# Patient Record
Sex: Male | Born: 1975
Health system: Southern US, Community
[De-identification: ages and names within clinical notes are randomized; demographics above are authoritative.]

## PROBLEM LIST (undated history)

## (undated) DIAGNOSIS — R030 Elevated blood-pressure reading, without diagnosis of hypertension: Secondary | ICD-10-CM

## (undated) DIAGNOSIS — F32A Depression, unspecified: Secondary | ICD-10-CM

## (undated) DIAGNOSIS — IMO0001 Reserved for inherently not codable concepts without codable children: Secondary | ICD-10-CM

## (undated) DIAGNOSIS — T7840XA Allergy, unspecified, initial encounter: Secondary | ICD-10-CM

## (undated) DIAGNOSIS — I10 Essential (primary) hypertension: Secondary | ICD-10-CM

## (undated) DIAGNOSIS — R7301 Impaired fasting glucose: Secondary | ICD-10-CM

## (undated) DIAGNOSIS — F419 Anxiety disorder, unspecified: Secondary | ICD-10-CM

## (undated) HISTORY — DX: Essential (primary) hypertension: I10

## (undated) HISTORY — PX: NO PAST SURGERIES: SHX2092

## (undated) HISTORY — DX: Reserved for inherently not codable concepts without codable children: IMO0001

## (undated) HISTORY — DX: Impaired fasting glucose: R73.01

## (undated) HISTORY — DX: Elevated blood-pressure reading, without diagnosis of hypertension: R03.0

## (undated) HISTORY — DX: Anxiety disorder, unspecified: F41.9

## (undated) HISTORY — DX: Allergy, unspecified, initial encounter: T78.40XA

## (undated) HISTORY — DX: Depression, unspecified: F32.A

---

## 2012-11-16 ENCOUNTER — Ambulatory Visit (INDEPENDENT_AMBULATORY_CARE_PROVIDER_SITE_OTHER): Payer: BC Managed Care – PPO | Admitting: General Surgery

## 2012-11-16 ENCOUNTER — Encounter: Payer: Self-pay | Admitting: General Surgery

## 2012-11-16 VITALS — BP 160/84 | HR 84 | Resp 12 | Ht 75.0 in | Wt 187.0 lb

## 2012-11-16 DIAGNOSIS — N5082 Scrotal pain: Secondary | ICD-10-CM

## 2012-11-16 DIAGNOSIS — R109 Unspecified abdominal pain: Secondary | ICD-10-CM

## 2012-11-16 DIAGNOSIS — N509 Disorder of male genital organs, unspecified: Secondary | ICD-10-CM

## 2012-11-16 DIAGNOSIS — R1030 Lower abdominal pain, unspecified: Secondary | ICD-10-CM

## 2012-11-16 DIAGNOSIS — N50819 Testicular pain, unspecified: Secondary | ICD-10-CM

## 2012-11-16 NOTE — Progress Notes (Signed)
Patient ID: Shawn Andrade, male   DOB: Jan 22, 1976, 37 y.o.   MRN: [id number]  Chief Complaint  Patient presents with  . Other    hernia    HPI Shawn Andrade is a 37 y.o. male here today for an evaluation of an right inguinal hernia.Patient states Dr.Crissman found it over 1 year and  it is starting to be tender and sore in the scrotum. He describes pain intermittent in nature, located in suprapubic area and in right testicular region. He has not noted a swelling in the groin. No fever or chills, no bowel or urinary symptoms.  HPI  History reviewed. No pertinent past medical history.  History reviewed. No pertinent past surgical history.  History reviewed. No pertinent family history.  Social History History  Substance Use Topics  . Smoking status: Current Every Day Smoker -- 1.00 packs/day for 15 years  . Smokeless tobacco: Never Used  . Alcohol Use: No    No Known Allergies  Current Outpatient Prescriptions  Medication Sig Dispense Refill  . FLUoxetine (PROZAC) 20 MG tablet Take 1 tablet by mouth 2 (two) times daily.       No current facility-administered medications for this visit.    Review of Systems Review of Systems  Constitutional: Negative.   Respiratory: Negative.   Cardiovascular: Negative.     Blood pressure 160/84, pulse 84, resp. rate 12, height 6\' 3"  (1.905 m), weight 187 lb (84.823 kg).  Physical Exam Physical Exam  Constitutional: He appears well-developed and well-nourished.  Eyes: Conjunctivae are normal. No scleral icterus.  Neck: Neck supple.  Cardiovascular: Normal rate, regular rhythm and normal heart sounds.   Pulmonary/Chest: Breath sounds normal.  Abdominal: Soft. Bowel sounds are normal. There is no hepatomegaly. There is no tenderness. No hernia.  exam of the inguinal regions showed no edvises  of as hernia   Genitourinary: Testes normal.    Data Reviewed None   Assessment   No hernia present on my exam today. Patient discomfort   Involves the suprapubic area and the scrotum area -would benefit with GU evaluation.    Plan    Patient to return as needed. Discussed findings fully with pt. If GU eval is normal and he continues to have pain a CT scan may be helpful    Patient to be scheduled to see a urologist. An appointment has been scheduled with Dr. Sebastian Ache at [facility] for 11-22-12 at 3:45 pm. He is aware of all instructions.    Gohan Collister G 11/16/2012, 6:50 PM

## 2012-11-16 NOTE — Patient Instructions (Addendum)
Patient to return as needed.   Patient to be referred to urology.

## 2014-11-14 ENCOUNTER — Other Ambulatory Visit: Payer: Self-pay | Admitting: Family Medicine

## 2014-11-16 ENCOUNTER — Telehealth: Payer: Self-pay | Admitting: Family Medicine

## 2014-11-16 MED ORDER — FLUOXETINE HCL 20 MG PO TABS: 20.0000 mg | ORAL_TABLET | Freq: Two times a day (BID) | ORAL | Status: DC

## 2014-11-16 NOTE — Telephone Encounter (Signed)
Notified patient that a refill has been send  To his pharmacy.

## 2014-11-16 NOTE — Telephone Encounter (Signed)
Pt called and stated that dr Laural Benes needed to [facility] for her "blessing" to do refill. They stated they needed to speak to her before they did her refill.

## 2014-11-16 NOTE — Telephone Encounter (Signed)
Please Review

## 2014-11-16 NOTE — Telephone Encounter (Signed)
I don't know what he's referring to about them needing to talk to me. He is due for a refill. Refill sent to his pharmacy, if they need anything else let me know.

## 2014-11-19 ENCOUNTER — Telehealth: Payer: Self-pay

## 2014-11-19 MED ORDER — SERTRALINE HCL 100 MG PO TABS: 100.0000 mg | ORAL_TABLET | Freq: Every day | ORAL | Status: DC

## 2014-11-19 NOTE — Telephone Encounter (Signed)
Patient states he is on sertraline. Rx sent to his pharmacy. Appointment scheduled for this week.

## 2014-11-19 NOTE — Telephone Encounter (Signed)
Patient returned my call, he states that he is on the Sertraline 100mg , he has an appointment scheduled for 11/22/14. He is completely out of medication.

## 2014-11-19 NOTE — Telephone Encounter (Signed)
Refill request from [facility] for Sertraline 100mg  , confused since Prozac was just refilled a few days ago. I tried to call and speak with the patient and verify what medication he is actually taking, but he did not answer.

## 2014-11-22 ENCOUNTER — Encounter: Payer: Self-pay | Admitting: Family Medicine

## 2014-11-22 ENCOUNTER — Ambulatory Visit (INDEPENDENT_AMBULATORY_CARE_PROVIDER_SITE_OTHER): Payer: BLUE CROSS/BLUE SHIELD | Admitting: Family Medicine

## 2014-11-22 VITALS — BP 118/82 | HR 60 | Temp 97.6°F | Resp 18 | Ht 74.0 in | Wt 201.0 lb

## 2014-11-22 DIAGNOSIS — Z720 Tobacco use: Secondary | ICD-10-CM

## 2014-11-22 DIAGNOSIS — R5382 Chronic fatigue, unspecified: Secondary | ICD-10-CM

## 2014-11-22 DIAGNOSIS — F411 Generalized anxiety disorder: Secondary | ICD-10-CM | POA: Diagnosis not present

## 2014-11-22 DIAGNOSIS — Z0000 Encounter for general adult medical examination without abnormal findings: Secondary | ICD-10-CM | POA: Diagnosis not present

## 2014-11-22 DIAGNOSIS — IMO0001 Reserved for inherently not codable concepts without codable children: Secondary | ICD-10-CM

## 2014-11-22 DIAGNOSIS — R030 Elevated blood-pressure reading, without diagnosis of hypertension: Secondary | ICD-10-CM

## 2014-11-22 DIAGNOSIS — F419 Anxiety disorder, unspecified: Secondary | ICD-10-CM | POA: Insufficient documentation

## 2014-11-22 MED ORDER — SERTRALINE HCL 100 MG PO TABS: 100.0000 mg | ORAL_TABLET | Freq: Every day | ORAL | Status: DC

## 2014-11-22 NOTE — Assessment & Plan Note (Signed)
Under good control at this time. Continue current regimen. Continue to monitor. Will continue his zoloft. Refill given today. Call with any concerns or problems. Will follow up in 6 months for PE.

## 2014-11-22 NOTE — Progress Notes (Signed)
BP 118/82 mmHg  Pulse 60  Temp(Src) 97.6 F (36.4 C)  Resp 18  Ht 6\' 2"  (1.88 m)  Wt 201 lb (91.173 kg)  BMI 25.80 kg/m2  SpO2 94%   Subjective:    Patient ID: Shawn Andrade, male    DOB: 1975-07-09, 39 y.o.   MRN: [id number]  HPI: Shawn Andrade is a 39 y.o. male  Chief Complaint  Patient presents with  . Anxiety    Patient states that anxiety is under good control.    ANXIETY/STRESS Duration:controlled Anxious mood: no  Excessive worrying: no Irritability: no  Sweating: no Nausea: no Palpitations:no Hyperventilation: no Panic attacks: no Agoraphobia: no  Obscessions/compulsions: no Depressed mood: no Depression screen [facility] 2/9 11/22/2014  Decreased Interest 0  Down, Depressed, Hopeless 0  PHQ - 2 Score 0  Altered sleeping 0  Tired, decreased energy 1  Change in appetite 0  Feeling bad or failure about yourself  0  Trouble concentrating 0  Moving slowly or fidgety/restless 0  Suicidal thoughts 0  PHQ-9 Score 1  Difficult doing work/chores Not difficult at all  GAD7: 0 Anhedonia: no Weight changes: no Insomnia: no   Hypersomnia: no Fatigue/loss of energy: yes Feelings of worthlessness: no Feelings of guilt: no Impaired concentration/indecisiveness: no Suicidal ideations: no  Crying spells: no Recent Stressors/Life Changes: no   Relationship problems: no   Family stress: no     Financial stress: no    Job stress: no    Recent death/loss: no118  Relevant past medical, surgical, family and social history reviewed and updated as indicated. Interim medical history since our last visit reviewed. Allergies and medications reviewed and updated.  Review of Systems  Constitutional: Negative.   Respiratory: Negative.   Cardiovascular: Negative.   Neurological: Negative.   Psychiatric/Behavioral: Negative.    Per HPI unless specifically indicated above    Objective:    BP 118/82 mmHg  Pulse 60  Temp(Src) 97.6 F (36.4 C)  Resp 18  Ht 6\' 2"   (1.88 m)  Wt 201 lb (91.173 kg)  BMI 25.80 kg/m2  SpO2 94%  Wt Readings from Last 3 Encounters:  11/22/14 201 lb (91.173 kg)  07/26/14 203 lb (92.08 kg)  11/16/12 187 lb (84.823 kg)    Physical Exam  Constitutional: He is oriented to person, place, and time. He appears well-developed and well-nourished. No distress.  HENT:  Head: Normocephalic and atraumatic.  Right Ear: Hearing normal.  Left Ear: Hearing normal.  Nose: Nose normal.  Eyes: Conjunctivae and lids are normal. Pupils are equal, round, and reactive to light. Right eye exhibits no discharge. Left eye exhibits no discharge. No scleral icterus.  Cardiovascular: Normal rate, regular rhythm and normal heart sounds.  Exam reveals no gallop and no friction rub.   No murmur heard. Pulmonary/Chest: Effort normal and breath sounds normal. No respiratory distress.  Musculoskeletal: Normal range of motion.  Neurological: He is alert and oriented to person, place, and time.  Skin: Skin is warm, dry and intact. No rash noted.  Psychiatric: He has a normal mood and affect. His speech is normal and behavior is normal. Judgment and thought content normal. Cognition and memory are normal.  Nursing note and vitals reviewed.       Assessment & Plan:   Problem List Items Addressed This Visit      Other   Anxiety disorder - Primary    Under good control at this time. Continue current regimen. Continue to monitor. Will continue  his zoloft. Refill given today. Call with any concerns or problems. Will follow up in 6 months for PE.        Other Visit Diagnoses    Tobacco abuse        Interested in quitting but afraid of side effects. Will consider and call if he wants to start something. Will send in chantix or wellbutrin and check in 1 mo        Follow up plan: Return in about 6 months (around 05/24/2015) for PE.

## 2014-11-22 NOTE — Patient Instructions (Addendum)
Stress and Stress Management Stress is a normal reaction to life events. It is what you feel when life demands more than you are used to or more than you can handle. Some stress can be useful. For example, the stress reaction can help you catch the last bus of the day, study for a test, or meet a deadline at work. But stress that occurs too often or for too long can cause problems. It can affect your emotional health and interfere with relationships and normal daily activities. Too much stress can weaken your immune system and increase your risk for physical illness. If you already have a medical problem, stress can make it worse. CAUSES  All sorts of life events may cause stress. An event that causes stress for one person may not be stressful for another person. Major life events commonly cause stress. These may be positive or negative. Examples include losing your job, moving into a new home, getting married, having a baby, or losing a loved one. Less obvious life events may also cause stress, especially if they occur day after day or in combination. Examples include working long hours, driving in traffic, caring for children, being in debt, or being in a difficult relationship. SIGNS AND SYMPTOMS Stress may cause emotional symptoms including, the following:  Anxiety. This is feeling worried, afraid, on edge, overwhelmed, or out of control.  Anger. This is feeling irritated or impatient.  Depression. This is feeling sad, down, helpless, or guilty.  Difficulty focusing, remembering, or making decisions. Stress may cause physical symptoms, including the following:   Aches and pains. These may affect your head, neck, back, stomach, or other areas of your body.  Tight muscles or clenched jaw.  Low energy or trouble sleeping. Stress may cause unhealthy behaviors, including the following:   Eating to feel better (overeating) or skipping meals.  Sleeping too little, too much, or both.  Working  too much or putting off tasks (procrastination).  Smoking, drinking alcohol, or using drugs to feel better. DIAGNOSIS  Stress is diagnosed through an assessment by your health care provider. Your health care provider will ask questions about your symptoms and any stressful life events.Your health care provider will also ask about your medical history and may order blood tests or other tests. Certain medical conditions and medicine can cause physical symptoms similar to stress. Mental illness can cause emotional symptoms and unhealthy behaviors similar to stress. Your health care provider may refer you to a mental health professional for further evaluation.  TREATMENT  Stress management is the recommended treatment for stress.The goals of stress management are reducing stressful life events and coping with stress in healthy ways.  Techniques for reducing stressful life events include the following:  Stress identification. Self-monitor for stress and identify what causes stress for you. These skills may help you to avoid some stressful events.  Time management. Set your priorities, keep a calendar of events, and learn to say "no." These tools can help you avoid making too many commitments. Techniques for coping with stress include the following:  Rethinking the problem. Try to think realistically about stressful events rather than ignoring them or overreacting. Try to find the positives in a stressful situation rather than focusing on the negatives.  Exercise. Physical exercise can release both physical and emotional tension. The key is to find a form of exercise you enjoy and do it regularly.  Relaxation techniques. These relax the body and mind. Examples include yoga, meditation, tai chi, biofeedback, deep  breathing, progressive muscle relaxation, listening to music, being out in nature, journaling, and other hobbies. Again, the key is to find one or more that you enjoy and can do  regularly.  Healthy lifestyle. Eat a balanced diet, get plenty of sleep, and do not smoke. Avoid using alcohol or drugs to relax.  Strong support network. Spend time with family, friends, or other people you enjoy being around.Express your feelings and talk things over with someone you trust. Counseling or talktherapy with a mental health professional may be helpful if you are having difficulty managing stress on your own. Medicine is typically not recommended for the treatment of stress.Talk to your health care provider if you think you need medicine for symptoms of stress. HOME CARE INSTRUCTIONS  Keep all follow-up visits as directed by your health care provider.  Take all medicines as directed by your health care provider. SEEK MEDICAL CARE IF:  Your symptoms get worse or you start having new symptoms.  You feel overwhelmed by your problems and can no longer manage them on your own. SEEK IMMEDIATE MEDICAL CARE IF:  You feel like hurting yourself or someone else. Document Released: 11/18/2000 Document Revised: 10/09/2013 Document Reviewed: 01/17/2013 [facility] Patient Information 2015 [place], [place]. This information is not intended to replace advice given to you by your health care provider. Make sure you discuss any questions you have with your health care provider. You Can Quit Smoking If you are ready to quit smoking or are thinking about it, congratulations! You have chosen to help yourself be healthier and live longer! There are lots of different ways to quit smoking. Nicotine gum, nicotine patches, a nicotine inhaler, or nicotine nasal spray can help with physical craving. Hypnosis, support groups, and medicines help break the habit of smoking. TIPS TO GET OFF AND STAY OFF CIGARETTES  Learn to predict your moods. Do not let a bad situation be your excuse to have a cigarette. Some situations in your life might tempt you to have a cigarette.  Ask friends and co-workers not to  smoke around you.  Make your home smoke-free.  Never have "just one" cigarette. It leads to wanting another and another. Remind yourself of your decision to quit.  On a card, make a list of your reasons for not smoking. Read it at least the same number of times a day as you have a cigarette. Tell yourself everyday, "I do not want to smoke. I choose not to smoke."  Ask someone at home or work to help you with your plan to quit smoking.  Have something planned after you eat or have a cup of coffee. Take a walk or get other exercise to perk you up. This will help to keep you from overeating.  Try a relaxation exercise to calm you down and decrease your stress. Remember, you may be tense and nervous the first two weeks after you quit. This will pass.  Find new activities to keep your hands busy. Play with a pen, coin, or rubber band. Doodle or draw things on paper.  Brush your teeth right after eating. This will help cut down the craving for the taste of tobacco after meals. You can try mouthwash too.  Try gum, breath mints, or diet candy to keep something in your mouth. IF YOU SMOKE AND WANT TO QUIT:  Do not stock up on cigarettes. Never buy a carton. Wait until one pack is finished before you buy another.  Never carry cigarettes with you at work  or at home.  Keep cigarettes as far away from you as possible. Leave them with someone else.  Never carry matches or a lighter with you.  Ask yourself, "Do I need this cigarette or is this just a reflex?"  Bet with someone that you can quit. Put cigarette money in a piggy bank every morning. If you smoke, you give up the money. If you do not smoke, by the end of the week, you keep the money.  Keep trying. It takes 21 days to change a habit!  Talk to your doctor about using medicines to help you quit. These include nicotine replacement gum, lozenges, or skin patches. Document Released: 03/21/2009 Document Revised: 08/17/2011 Document  Reviewed: 03/21/2009 [facility] Patient Information 2015 [place], [place]. This information is not intended to replace advice given to you by your health care provider. Make sure you discuss any questions you have with your health care provider.

## 2015-03-13 ENCOUNTER — Telehealth: Payer: Self-pay | Admitting: Family Medicine

## 2015-03-13 NOTE — Telephone Encounter (Signed)
Pt called stated he needs a refill on Sertraline. [facility] [place]. Thanks.

## 2015-03-13 NOTE — Telephone Encounter (Signed)
[facility] refills, they will fill and contact patient

## 2015-03-20 ENCOUNTER — Ambulatory Visit: Payer: Commercial Indemnity

## 2015-03-20 ENCOUNTER — Ambulatory Visit
Admission: EM | Admit: 2015-03-20 | Discharge: 2015-03-20 | Disposition: A | Discharge status: Home / Self Care | Payer: Commercial Indemnity | Attending: Family Medicine | Admitting: Family Medicine

## 2015-03-20 ENCOUNTER — Encounter: Payer: Self-pay | Admitting: Unknown Physician Specialty

## 2015-03-20 ENCOUNTER — Ambulatory Visit (INDEPENDENT_AMBULATORY_CARE_PROVIDER_SITE_OTHER): Payer: Managed Care, Other (non HMO) | Admitting: Unknown Physician Specialty

## 2015-03-20 VITALS — BP 144/90 | HR 72 | Temp 98.1°F | Ht 74.2 in | Wt 204.6 lb

## 2015-03-20 DIAGNOSIS — Z23 Encounter for immunization: Secondary | ICD-10-CM | POA: Diagnosis not present

## 2015-03-20 DIAGNOSIS — R7301 Impaired fasting glucose: Secondary | ICD-10-CM | POA: Insufficient documentation

## 2015-03-20 DIAGNOSIS — T148 Other injury of unspecified body region: Secondary | ICD-10-CM

## 2015-03-20 DIAGNOSIS — J309 Allergic rhinitis, unspecified: Secondary | ICD-10-CM | POA: Insufficient documentation

## 2015-03-20 DIAGNOSIS — T148XXA Other injury of unspecified body region, initial encounter: Secondary | ICD-10-CM

## 2015-03-20 DIAGNOSIS — L03012 Cellulitis of left finger: Secondary | ICD-10-CM

## 2015-03-20 MED ORDER — SULFAMETHOXAZOLE-TRIMETHOPRIM 800-160 MG PO TABS: 1.0000 | ORAL_TABLET | Freq: Two times a day (BID) | ORAL | Status: DC

## 2015-03-20 NOTE — Discharge Instructions (Signed)
Keep clean. Warm soaks as discussed. Elevate. Take antibiotic prescription as directed.   Follow up closely with PCP in 2-3 days. Return to Urgent care for new or worsening concerns.   Cellulitis Cellulitis is an infection of the skin and the tissue beneath it. The infected area is usually red and tender. Cellulitis occurs most often in the arms and lower legs.  CAUSES  Cellulitis is caused by bacteria that enter the skin through cracks or cuts in the skin. The most common types of bacteria that cause cellulitis are staphylococci and streptococci. SIGNS AND SYMPTOMS   Redness and warmth.  Swelling.  Tenderness or pain.  Fever. DIAGNOSIS  Your health care provider can usually determine what is wrong based on a physical exam. Blood tests may also be done. TREATMENT  Treatment usually involves taking an antibiotic medicine. HOME CARE INSTRUCTIONS   Take your antibiotic medicine as directed by your health care provider. Finish the antibiotic even if you start to feel better.  Keep the infected arm or leg elevated to reduce swelling.  Apply a warm cloth to the affected area up to 4 times per day to relieve pain.  Take medicines only as directed by your health care provider.  Keep all follow-up visits as directed by your health care provider. SEEK MEDICAL CARE IF:   You notice red streaks coming from the infected area.  Your red area gets larger or turns dark in color.  Your bone or joint underneath the infected area becomes painful after the skin has healed.  Your infection returns in the same area or another area.  You notice a swollen bump in the infected area.  You develop new symptoms.  You have a fever. SEEK IMMEDIATE MEDICAL CARE IF:   You feel very sleepy.  You develop vomiting or diarrhea.  You have a general ill feeling (malaise) with muscle aches and pains.   This information is not intended to replace advice given to you by your health care provider. Make  sure you discuss any questions you have with your health care provider.   Document Released: 03/04/2005 Document Revised: 02/13/2015 Document Reviewed: 08/10/2011 Elsevier Interactive Patient Education [employer]2016 [employer] Inc.

## 2015-03-20 NOTE — Progress Notes (Signed)
   BP 144/90 mmHg  Pulse 72  Temp(Src) 98.1 F (36.7 C)  Ht 6' 2.2" (1.885 m)  Wt 204 lb 9.6 oz (92.806 kg)  BMI 26.12 kg/m2  SpO2 98%   Subjective:    Patient ID: Shawn Andrade, male    DOB: 08/11/1975, 39 y.o.   MRN: [id number]  HPI: Shawn Andrade is a 39 y.o. male  Chief Complaint  Patient presents with  . Hand Pain    pt states his index finger on his left hand is swollen and painful. States he got a splinter yesterday morning and finger has been swollen and painful since. States he cannot bend the finger.    As above, Pt states he took the splinter out and became swollen.  He tried to squeeze it and get the fluid out and got a tiny amount of serous fluid.  No fever.    Relevant past medical, surgical, family and social history reviewed and updated as indicated. Interim medical history since our last visit reviewed. Allergies and medications reviewed and updated.  Review of Systems  All other systems reviewed and are negative.   Per HPI unless specifically indicated above     Objective:    BP 144/90 mmHg  Pulse 72  Temp(Src) 98.1 F (36.7 C)  Ht 6' 2.2" (1.885 m)  Wt 204 lb 9.6 oz (92.806 kg)  BMI 26.12 kg/m2  SpO2 98%  Wt Readings from Last 3 Encounters:  03/20/15 204 lb 9.6 oz (92.806 kg)  11/22/14 201 lb (91.173 kg)  07/26/14 203 lb (92.08 kg)    Physical Exam  Constitutional: He is oriented to person, place, and time. He appears well-developed and well-nourished. No distress.  HENT:  Head: Normocephalic and atraumatic.  Eyes: Conjunctivae and lids are normal. Right eye exhibits no discharge. Left eye exhibits no discharge. No scleral icterus.  Cardiovascular: Normal rate and regular rhythm.   Pulmonary/Chest: Effort normal. No respiratory distress.  Abdominal: Normal appearance. There is no splenomegaly or hepatomegaly.  Musculoskeletal: Normal range of motion.  Left ring finger with swollen and tender PIP joint.    Neurological: He is alert and  oriented to person, place, and time.  Skin: Skin is intact. No rash noted. No pallor.  Psychiatric: He has a normal mood and affect. His behavior is normal. Judgment and thought content normal.  Vitals reviewed.    Assessment & Plan:   Problem List Items Addressed This Visit    None    Visit Diagnoses    Cellulitis of finger of left hand    -  Primary    discussed pt with Dr. Dossie Arbourrissman who came in to see patient.  Rx for antibiotics.  Rx for Septra.  Refer to hand surgeon for tomorrow to evaluate.      Relevant Orders    Ambulatory referral to Orthopedic Surgery        Follow up plan: Return for With Dr. Hyacinth MeekerMiller ASAP.

## 2015-03-20 NOTE — ED Provider Notes (Signed)
[facility] Emergency Department Provider Note  ____________________________________________  Time seen: Approximately 7:09 PM  I have reviewed the triage vital signs and the nursing notes.   HISTORY  Chief Complaint Finger Injury  HPI Shawn Andrade is a 39 y.o. male presents for complaints of pain, redness and swelling to left second finger. States yesterday he grabbed a 2x4 piece of wood and obtained a splinter to left second finger at area of discomfort. Patient reports he then pulled out splinter. States unsure if he got all of the splinter out. States some pain and tenderness initially, but reports gradually redness and swelling. States current pain is 5/10 to left second finger. Denies pain radiation. Denies numbness or tingling sensation. States pain is mostly with bending or touching the area that is swollen. Denies other pain to hand. Denies falling or direct trauma. Denies other pain. Denies fevers.   Reports very small amount of drainage expressed when "squeezed". PAtient states wants the area opened.   Reports he went to his PCP office this afternoon at Dr Christell Faith office and reports he was told infection and Rx for oral antibiotics given. States PCP told him they did not need to perform I & D at that time, and they would refer him to Dr Robbie Lis ortho for I&D if needed. Reports rx for BID Bactrim x 10 days. Reports tetanus was updated at PCP office.     Past Medical History  Diagnosis Date  . Allergy   . Elevated blood pressure   . IFG (impaired fasting glucose)   . Anxiety     Patient Active Problem List   Diagnosis Date Noted  . Allergic rhinitis 03/20/2015  . IFG (impaired fasting glucose) 03/20/2015  . Anxiety disorder 11/22/2014  . Scrotal pain 11/16/2012  . Groin pain 11/16/2012  . Testicular pain 11/16/2012    Past Surgical History  Procedure Laterality Date  . No past surgeries      Current Outpatient Rx  Name  Route  Sig   Dispense  Refill  . LORazepam (ATIVAN) 1 MG tablet   Oral   Take 1 mg by mouth every 8 (eight) hours. Take 1/2 to 1 tablet daily PRN         . sertraline (ZOLOFT) 100 MG tablet   Oral   Take 1 tablet (100 mg total) by mouth daily.   30 tablet   6   . sulfamethoxazole-trimethoprim (BACTRIM DS,SEPTRA DS) 800-160 MG tablet   Oral   Take 1 tablet by mouth 2 (two) times daily.   20 tablet   0     Allergies Review of patient's allergies indicates no known allergies.  Family History  Problem Relation Age of Onset  . Hypertension Father   . Hypertension Mother   . Anxiety disorder Mother   . Cancer Paternal Grandfather     Social History Social History  Substance Use Topics  . Smoking status: Current Every Day Smoker -- 1.00 packs/day for 15 years    Types: Cigarettes  . Smokeless tobacco: Current User    Types: Snuff  . Alcohol Use: No     Comment: one or less per day    Review of Systems Constitutional: No fever/chills Eyes: No visual changes. ENT: No sore throat. Cardiovascular: Denies chest pain. Respiratory: Denies shortness of breath. Gastrointestinal: No abdominal pain.  No nausea, no vomiting.  No diarrhea.  No constipation. Genitourinary: Negative for dysuria. Musculoskeletal: Negative for back pain. Left second finger redness and  swelling.  Skin: Negative for rash. Neurological: Negative for headaches, focal weakness or numbness.  10-point ROS otherwise negative.  ____________________________________________   PHYSICAL EXAM:  VITAL SIGNS: ED Triage Vitals  Enc Vitals Group     BP 03/20/15 1753 149/97 mmHg     Pulse Rate 03/20/15 1753 84     Resp 03/20/15 1753 16     Temp 03/20/15 1753 97.5 F (36.4 C)     Temp Source 03/20/15 1753 Oral     SpO2 03/20/15 1753 99 %     Weight 03/20/15 1753 205 lb (92.987 kg)     Height 03/20/15 1753  (1.93 m)     Head Cir --      Peak Flow --      Pain Score 03/20/15 1756 1     Pain Loc --      Pain  Edu? --      Excl. in GC? --     Constitutional: Alert and oriented. Well appearing and in no acute distress. Eyes: Conjunctivae are normal. PERRL. EOMI. Head: Atraumatic.  Nose: No congestion/rhinnorhea.  Mouth/Throat: Mucous membranes are moist. Cardiovascular: Normal rate, regular rhythm. Grossly normal heart sounds.  Good peripheral circulation. Respiratory: Normal respiratory effort.  No retractions. Lungs CTAB. Musculoskeletal: No lower or upper extremity tenderness nor edema.  No joint effusions. Left second finger pain, see skin below.  Neurologic:  Normal speech and language.No gait instability. Skin:  Skin is warm, dry and intact. No rash noted. Except: left second finger along PIP joint with mild swelling, mild erythema, small crusting abrasion medial PIP joint consistent of reported location of splinter insertion site. Minimal fluctuance. No induration. No visible foreign body. No tendon or motor deficit. Full ROM, except slight decreased ability to flex at PIP joint only, sensation intact. Cap refill < 2 secs. Left hand otherwise nontender.  Psychiatric: Mood and affect are normal. Speech and behavior are normal.  ____________________________________________   LABS (all labs ordered are listed, but only abnormal results are displayed)  Labs Reviewed - No data to display   PROCEDURES  Procedure(s) performed:   Procedure explained and verbal consent obtained. Consent: Verbal consent obtained. Written consent not obtained. Risks and benefits: risks, benefits and alternatives were discussed Patient identity confirmed: verbally with patient and hospital-assigned identification number  Consent given by: patient   Incision & Drainage Procedure completed in sterile manner.  Location: Left second medial finger lateral to PIP joint Left hand soaked in betadine and saline.  Area cleaned and prepped with betadine.  Patient opted to defer anesthesia.  #11 blade scalpel used  for incision, small superficial incision made at sight of present abrasion.  Very small amount purulent drainage expressed.  Probed with sterile forceps to break up loculation.  Forceps used to explore would and remove foreign body. Very small foreign body yellowish and wood grain appearance removed.  Again cleaned with betadine.  Patient tolerated well and reports well improved post procedure. Dressing applied.   Antibiotic ointment and dressing applied.  Wound care instructions provided.  Observe for any signs of infection or other problems.     Radiology EXAM: LEFT SECOND FINGER 2+V  COMPARISON: None.  FINDINGS: Frontal, oblique, and lateral views were obtained. There is soft tissue swelling in the region of the second PIP joint, most markedly along the dorsal aspect. No radiopaque foreign body is appreciated. No fracture or dislocation. Joint spaces appear intact. No erosive change or bony destruction.  IMPRESSION: Soft tissue swelling in  the region of the second PIP joint, particularly along the dorsal aspect. No bony abnormality. No radiopaque foreign body. Joint spaces appear normal.   Electronically Signed By: Bretta BangWilliam Woodruff III M.D. On: 03/20/2015 18:39       ______________________________________   INITIAL IMPRESSION / ASSESSMENT AND PLAN / ED COURSE  Pertinent labs & imaging results that were available during my care of the patient were reviewed by me and considered in my medical decision making (see chart for details).  Presents for redness, swelling and pain to left second finger. Reports obtained splinter at home yesterday, pulled splinter out and gradually pain and swelling. Seen by PCP today and diagnoses with cellulitis of left second finger and RX bactrim. Left second finger xray obtained and showed soft tissue swelling in the region of second PIP joint, no bony abnormality, no radiopaque foreign body, joint spaces appeared normal per radiology.    Patient deferred anesthesia. Small superficial incision made at site and obtained very small amount of purulence but small foreign body, likely remnants of wood splinter found and removed, no other foreign body found. Patient reports post procedure pain and swelling improved. Patient reports that he is able to move and bend finger much more easily and without as much discomfort. Patient directed to continue antibiotic prescription of Bactrim previously given by primary care physician. Directed to elevate hand as well as intermittently soak in warm water. Discussed cleaning multiple times per day. Discussed close monitoring as well as close follow-up with primary care physician. Patient denies need for pain medication. States he will take over-the-counter Tylenol or ibuprofen as needed for pain. Tetanus immunization was updated at primary care office today.Discussed follow up with Primary care physician this week. Discussed follow up and return parameters including no resolution or any worsening concerns. Patient verbalized understanding and agreed to plan.   ____________________________________________   FINAL CLINICAL IMPRESSION(S) / ED DIAGNOSES  Final diagnoses:  Cellulitis of left index finger  Splinter in skin       Renford DillsLindsey Virginia Curl, NP 03/20/15 2028

## 2015-03-20 NOTE — ED Notes (Signed)
Yesterday c/o left index finger swelling and pain since yesterday. Pt scraped hand against a 2X4. Pt reports he retrieved a splinter from the affected area. Pt requesting an I & D.

## 2015-03-22 ENCOUNTER — Telehealth: Payer: Self-pay

## 2015-03-22 NOTE — Telephone Encounter (Signed)
Called Mr. Caroleen HammanRumley and left message on machine requesting that he return my call regarding his referral appointment.  Patient was seen in the ED and would like to know if he still needs to see ortho.

## 2015-03-22 NOTE — Telephone Encounter (Signed)
Spoke with patient and he stated that he ended up going to [facility] a few hours after leaving our office on 03/20/15 due to sever pain.  He stated that the Urgent Care was able to relieve the pressure and pain and that he no longer needs to see an Ortho.

## 2015-05-17 ENCOUNTER — Encounter: Payer: BLUE CROSS/BLUE SHIELD | Admitting: Family Medicine

## 2015-06-04 ENCOUNTER — Encounter: Payer: Managed Care, Other (non HMO) | Admitting: Family Medicine

## 2015-06-24 ENCOUNTER — Encounter: Payer: Managed Care, Other (non HMO) | Admitting: Family Medicine

## 2015-07-01 ENCOUNTER — Encounter: Payer: Self-pay | Admitting: Family Medicine

## 2015-07-01 ENCOUNTER — Ambulatory Visit (INDEPENDENT_AMBULATORY_CARE_PROVIDER_SITE_OTHER): Payer: Managed Care, Other (non HMO) | Admitting: Family Medicine

## 2015-07-01 VITALS — BP 122/92 | HR 69 | Temp 98.5°F | Ht 74.0 in | Wt 203.0 lb

## 2015-07-01 DIAGNOSIS — F411 Generalized anxiety disorder: Secondary | ICD-10-CM | POA: Diagnosis not present

## 2015-07-01 DIAGNOSIS — Z720 Tobacco use: Secondary | ICD-10-CM | POA: Diagnosis not present

## 2015-07-01 DIAGNOSIS — Z0000 Encounter for general adult medical examination without abnormal findings: Secondary | ICD-10-CM

## 2015-07-01 DIAGNOSIS — R030 Elevated blood-pressure reading, without diagnosis of hypertension: Secondary | ICD-10-CM

## 2015-07-01 DIAGNOSIS — R7301 Impaired fasting glucose: Secondary | ICD-10-CM | POA: Diagnosis not present

## 2015-07-01 DIAGNOSIS — R5382 Chronic fatigue, unspecified: Secondary | ICD-10-CM | POA: Diagnosis not present

## 2015-07-01 DIAGNOSIS — Z79899 Other long term (current) drug therapy: Secondary | ICD-10-CM

## 2015-07-01 DIAGNOSIS — IMO0001 Reserved for inherently not codable concepts without codable children: Secondary | ICD-10-CM

## 2015-07-01 DIAGNOSIS — Z113 Encounter for screening for infections with a predominantly sexual mode of transmission: Secondary | ICD-10-CM

## 2015-07-01 LAB — CBC WITH DIFFERENTIAL/PLATELET
Hematocrit: 46.6 % (ref 37.5–51.0)
Hemoglobin: 16.9 g/dL (ref 12.6–17.7)
Lymphocytes Absolute: 2.4 10*3/uL (ref 0.7–3.1)
Lymphs: 29 %
MCH: 30.6 pg (ref 26.6–33.0)
MCHC: 36.3 g/dL — ABNORMAL HIGH (ref 31.5–35.7)
MCV: 84 fL (ref 79–97)
MID (Absolute): 0.7 10*3/uL (ref 0.1–1.6)
MID: 8 %
Neutrophils Absolute: 5.3 10*3/uL (ref 1.4–7.0)
Neutrophils: 63 %
PLATELETS: 271 10*3/uL (ref 150–379)
RBC: 5.53 x10E6/uL (ref 4.14–5.80)
RDW: 14 % (ref 12.3–15.4)
WBC: 8.4 10*3/uL (ref 3.4–10.8)

## 2015-07-01 LAB — LIPID PANEL PICCOLO, WAIVED
CHOL/HDL RATIO PICCOLO,WAIVE: 3.8 mg/dL
CHOLESTEROL PICCOLO, WAIVED: 218 mg/dL — AB (ref <200)
HDL CHOL PICCOLO, WAIVED: 57 mg/dL — AB (ref >59)
LDL CHOL CALC PICCOLO WAIVED: 109 mg/dL — AB (ref <100)
Triglycerides Piccolo,Waived: 260 mg/dL — ABNORMAL HIGH (ref <150)
VLDL Chol Calc Piccolo,Waive: 52 mg/dL — ABNORMAL HIGH (ref <30)

## 2015-07-01 LAB — MICROALBUMIN, URINE WAIVED
CREATININE, URINE WAIVED: 300 mg/dL (ref 10–300)
MICROALB, UR WAIVED: 80 mg/L — AB (ref 0–19)

## 2015-07-01 MED ORDER — BUPROPION HCL ER (SR) 150 MG PO TB12
ORAL_TABLET | ORAL | Status: DC
Start: 2015-07-01 — End: 2016-08-13

## 2015-07-01 MED ORDER — SERTRALINE HCL 100 MG PO TABS: 100.0000 mg | ORAL_TABLET | Freq: Every day | ORAL | Status: DC

## 2015-07-01 MED ORDER — LORAZEPAM 1 MG PO TABS
ORAL_TABLET | ORAL | Status: DC
Start: 2015-07-01 — End: 2017-01-29

## 2015-07-01 NOTE — Progress Notes (Signed)
BP 122/92 mmHg  Pulse 69  Temp(Src) 98.5 F (36.9 C)  Ht  (1.88 m)  Wt 203 lb (92.08 kg)  BMI 26.05 kg/m2  SpO2 93%   Subjective:    Patient ID: Shawn Andrade, male    DOB: [birth year], 40 y.o.   MRN: [id number]  HPI: Shawn Andrade is a 40 y.o. male presenting on 07/01/2015 for comprehensive medical examination. Current medical complaints include:   SMOKING CESSATION Smoking Status: currently smokes cigarettes Smoking Amount: 1ppd Smoking Onset: [age]yo Smoking Quit Date: Not set Smoking triggers: drinking, eating, stress Type of tobacco use: also dips Children in the house: yes Other household members who smoke: no Treatments attempted: cold [place], buproprion, nicotine patches Pneumovax: Refused  ANXIETY/STRESS Duration:controlled Anxious mood: no  Excessive worrying: no Irritability: no  Sweating: no Nausea: no Palpitations:no Hyperventilation: no Panic attacks: no Agoraphobia: no  Obscessions/compulsions: no Depressed mood: no Depression screen [facility] 2/9 07/01/2015 11/22/2014  Decreased Interest 0 0  Down, Depressed, Hopeless 0 0  PHQ - 2 Score 0 0  Altered sleeping - 0  Tired, decreased energy - 1  Change in appetite - 0  Feeling bad or failure about yourself  - 0  Trouble concentrating - 0  Moving slowly or fidgety/restless - 0  Suicidal thoughts - 0  PHQ-9 Score - 1  Difficult doing work/chores - Not difficult at all   Anhedonia: no Weight changes: no Insomnia: no   Hypersomnia: no Fatigue/loss of energy: yes Feelings of worthlessness: no Feelings of guilt: no Impaired concentration/indecisiveness: no Suicidal ideations: no  Crying spells: no Recent Stressors/Life Changes: no  He currently lives with: wife and kids Interim Problems from his last visit: no  Past Medical History:  Past Medical History  Diagnosis Date  . Allergy   . Elevated blood pressure   . IFG (impaired fasting glucose)   . Anxiety     Surgical History:  Past  Surgical History  Procedure Laterality Date  . No past surgeries      Medications:  No current outpatient prescriptions on file prior to visit.   No current facility-administered medications on file prior to visit.    Allergies:  No Known Allergies  Social History:  Social History   Social History  . Marital Status: Married    Spouse Name: N/A  . Number of Children: 2  . Years of Education: N/A   Occupational History  . FIREFIGHTER    Social History Main Topics  . Smoking status: Current Every Day Smoker -- 1.00 packs/day for 15 years    Types: Cigarettes  . Smokeless tobacco: Current User    Types: Snuff  . Alcohol Use: No     Comment: one or less per day  . Drug Use: No  . Sexual Activity: Yes   Other Topics Concern  . Not on file   Social History Narrative   History  Smoking status  . Current Every Day Smoker -- 1.00 packs/day for 15 years  . Types: Cigarettes  Smokeless tobacco  . Current User  . Types: Snuff   History  Alcohol Use No    Comment: one or less per day    Family History:  Family History  Problem Relation Age of Onset  . Hypertension Father   . Hypertension Mother   . Anxiety disorder Mother   . Cancer Paternal Grandfather     Past medical history, surgical history, medications, allergies, family history and social history reviewed with patient today  and changes made to appropriate areas of the chart.   Review of Systems  Constitutional: Negative.   HENT: Positive for congestion. Negative for ear discharge, ear pain, hearing loss, nosebleeds, sore throat and tinnitus.   Eyes: Negative.   Respiratory: Positive for cough and shortness of breath (due to smoking). Negative for hemoptysis, sputum production, wheezing and stridor.   Cardiovascular: Negative.   Gastrointestinal: Positive for heartburn. Negative for nausea, vomiting, abdominal pain, diarrhea, constipation, blood in stool and melena.  Genitourinary: Negative.    Musculoskeletal: Negative.   Skin: Negative.   Neurological: Positive for headaches. Negative for dizziness, tingling, tremors, sensory change, speech change, focal weakness, seizures and loss of consciousness.  Endo/Heme/Allergies: Negative.   Psychiatric/Behavioral: Negative.     All other ROS negative except what is listed above and in the HPI.      Objective:    BP 122/92 mmHg  Pulse 69  Temp(Src) 98.5 F (36.9 C)  Ht  (1.88 m)  Wt 203 lb (92.08 kg)  BMI 26.05 kg/m2  SpO2 93%  Wt Readings from Last 3 Encounters:  07/01/15 203 lb (92.08 kg)  03/20/15 205 lb (92.987 kg)  03/20/15 204 lb 9.6 oz (92.806 kg)    Physical Exam  Constitutional: He is oriented to person, place, and time. He appears well-developed and well-nourished. No distress.  HENT:  Head: Normocephalic and atraumatic.  Right Ear: Hearing, tympanic membrane, external ear and ear canal normal.  Left Ear: Hearing, tympanic membrane, external ear and ear canal normal.  Nose: Nose normal.  Mouth/Throat: Uvula is midline, oropharynx is clear and moist and mucous membranes are normal. No oropharyngeal exudate.  Eyes: Conjunctivae, EOM and lids are normal. Pupils are equal, round, and reactive to light. Right eye exhibits no discharge. Left eye exhibits no discharge. No scleral icterus.  Neck: Normal range of motion. Neck supple. No JVD present. No tracheal deviation present. No thyromegaly present.  Cardiovascular: Normal rate, regular rhythm, normal heart sounds and intact distal pulses.  Exam reveals no gallop and no friction rub.   No murmur heard. Pulmonary/Chest: Effort normal and breath sounds normal. No stridor. No respiratory distress. He has no wheezes. He has no rales. He exhibits no tenderness.  Abdominal: Soft. Bowel sounds are normal. He exhibits no distension and no mass. There is no rebound and no guarding.  Genitourinary: Testes normal and penis normal. Cremasteric reflex is present. Right testis  shows no mass, no swelling and no tenderness. Right testis is descended. Cremasteric reflex is not absent on the right side. Left testis shows no mass, no swelling and no tenderness. Left testis is descended. Cremasteric reflex is not absent on the left side. Circumcised. No penile tenderness.  Musculoskeletal: Normal range of motion. He exhibits no edema or tenderness.  Lymphadenopathy:    He has no cervical adenopathy.  Neurological: He is alert and oriented to person, place, and time. He has normal reflexes. He displays normal reflexes. No cranial nerve deficit. He exhibits normal muscle tone. Coordination normal.  Skin: Skin is warm, dry and intact. No rash noted. He is not diaphoretic. No erythema. No pallor.  Psychiatric: He has a normal mood and affect. His speech is normal and behavior is normal. Judgment and thought content normal. Cognition and memory are normal.  Nursing note and vitals reviewed.   No results found for this or any previous visit.    Assessment & Plan:   Problem List Items Addressed This Visit  Endocrine   IFG (impaired fasting glucose)    Repeat A1c drawn today. Await results.       Relevant Orders   Hgb A1c w/o eAG     Other   Anxiety disorder    Under good control. Refill of his medications given. Continue to monitor. Recheck 6 months.       Controlled substance agreement signed    PMP reviewed and appropriate. New CSA signed as his previous one was in [place]. Lorazepam given.        Other Visit Diagnoses    Routine general medical examination at a health care facility    -  Primary    Up to date on vaccines excpet pneuomvax which he refused. Screening labs checked today. Continue diet and exercise.     Tobacco abuse        Interested in quitting. Will try wellbutrin. Rx given today. Will check back in in 1 month. Monitor and call with any problems.     Chronic fatigue        Labs checked today. Await results.     Elevated BP        Looks good  today. Continue diet and exercise. Continue to monitor.     Routine screening for STI (sexually transmitted infection)        HIV checked today. Await results.     Relevant Orders    HIV antibody        Discussed aspirin prophylaxis for myocardial infarction prevention and decision was it was not indicated  LABORATORY TESTING:  Health maintenance labs ordered today as discussed above.   IMMUNIZATIONS:   - Tdap: Tetanus vaccination status reviewed: last tetanus booster within 10 years. - Influenza: Up to date - Pneumovax: Refused  PATIENT COUNSELING:    Sexuality: Discussed sexually transmitted diseases, partner selection, use of condoms, avoidance of unintended pregnancy  and contraceptive alternatives.   Advised to avoid cigarette smoking.  I discussed with the patient that most people either abstain from alcohol or drink within safe limits (<=14/week and <=4 drinks/occasion for males, <=7/weeks and <= 3 drinks/occasion for females) and that the risk for alcohol disorders and other health effects rises proportionally with the number of drinks per week and how often a drinker exceeds daily limits.  Discussed cessation/primary prevention of drug use and availability of treatment for abuse.   Diet: Encouraged to adjust caloric intake to maintain  or achieve ideal body weight, to reduce intake of dietary saturated fat and total fat, to limit sodium intake by avoiding high sodium foods and not adding table salt, and to maintain adequate dietary potassium and calcium preferably from fresh fruits, vegetables, and low-fat dairy products.    stressed the importance of regular exercise  Injury prevention: Discussed safety belts, safety helmets, smoke detector, smoking near bedding or upholstery.   Dental health: Discussed importance of regular tooth brushing, flossing, and dental visits.   Follow up plan: NEXT PREVENTATIVE PHYSICAL DUE IN 1 YEAR. Return in about 4 weeks (around  07/29/2015) for follow up smoking.

## 2015-07-01 NOTE — Assessment & Plan Note (Signed)
Under good control. Refill of his medications given. Continue to monitor. Recheck 6 months.

## 2015-07-01 NOTE — Assessment & Plan Note (Signed)
PMP reviewed and appropriate. New CSA signed as his previous one was in [place]. Lorazepam given.

## 2015-07-01 NOTE — Patient Instructions (Addendum)
Health Maintenance, Male A healthy lifestyle and preventative care can promote health and wellness.  Maintain regular health, dental, and eye exams.  Eat a healthy diet. Foods like vegetables, fruits, whole grains, low-fat dairy products, and lean protein foods contain the nutrients you need and are low in calories. Decrease your intake of foods high in solid fats, added sugars, and salt. Get information about a proper diet from your health care provider, if necessary.  Regular physical exercise is one of the most important things you can do for your health. Most adults should get at least 150 minutes of moderate-intensity exercise (any activity that increases your heart rate and causes you to sweat) each week. In addition, most adults need muscle-strengthening exercises on 2 or more days a week.   Maintain a healthy weight. The body mass index (BMI) is a screening tool to identify possible weight problems. It provides an estimate of body fat based on height and weight. Your health care provider can find your BMI and can help you achieve or maintain a healthy weight. For males 20 years and older:  A BMI below 18.5 is considered underweight.  A BMI of 18.5 to 24.9 is normal.  A BMI of 25 to 29.9 is considered overweight.  A BMI of 30 and above is considered obese.  Maintain normal blood lipids and cholesterol by exercising and minimizing your intake of saturated fat. Eat a balanced diet with plenty of fruits and vegetables. Blood tests for lipids and cholesterol should begin at age 20 and be repeated every 5 years. If your lipid or cholesterol levels are high, you are over age 50, or you are at high risk for heart disease, you may need your cholesterol levels checked more frequently.Ongoing high lipid and cholesterol levels should be treated with medicines if diet and exercise are not working.  If you smoke, find out from your health care provider how to quit. If you do not use tobacco, do not  start.  Lung cancer screening is recommended for adults aged 55-80 years who are at high risk for developing lung cancer because of a history of smoking. A yearly low-dose CT scan of the lungs is recommended for people who have at least a 30-pack-year history of smoking and are current smokers or have quit within the past 15 years. A pack year of smoking is smoking an average of 1 pack of cigarettes a day for 1 year (for example, a 30-pack-year history of smoking could mean smoking 1 pack a day for 30 years or 2 packs a day for 15 years). Yearly screening should continue until the smoker has stopped smoking for at least 15 years. Yearly screening should be stopped for people who develop a health problem that would prevent them from having lung cancer treatment.  If you choose to drink alcohol, do not have more than 2 drinks per day. One drink is considered to be 12 oz (360 mL) of beer, 5 oz (150 mL) of wine, or 1.5 oz (45 mL) of liquor.  Avoid the use of street drugs. Do not share needles with anyone. Ask for help if you need support or instructions about stopping the use of drugs.  High blood pressure causes heart disease and increases the risk of stroke. High blood pressure is more likely to develop in:  People who have blood pressure in the end of the normal range (100-139/85-89 mm Hg).  People who are overweight or obese.  People who are African American.    If you are 18-39 years of age, have your blood pressure checked every 3-5 years. If you are 40 years of age or older, have your blood pressure checked every year. You should have your blood pressure measured twice--once when you are at a hospital or clinic, and once when you are not at a hospital or clinic. Record the average of the two measurements. To check your blood pressure when you are not at a hospital or clinic, you can use:  An automated blood pressure machine at a pharmacy.  A home blood pressure monitor.  If you are 45-79 years  old, ask your health care provider if you should take aspirin to prevent heart disease.  Diabetes screening involves taking a blood sample to check your fasting blood sugar level. This should be done once every 3 years after age 45 if you are at a normal weight and without risk factors for diabetes. Testing should be considered at a younger age or be carried out more frequently if you are overweight and have at least 1 risk factor for diabetes.  Colorectal cancer can be detected and often prevented. Most routine colorectal cancer screening begins at the age of 50 and continues through age 75. However, your health care provider may recommend screening at an earlier age if you have risk factors for colon cancer. On a yearly basis, your health care provider may provide home test kits to check for hidden blood in the stool. A small camera at the end of a tube may be used to directly examine the colon (sigmoidoscopy or colonoscopy) to detect the earliest forms of colorectal cancer. Talk to your health care provider about this at age 50 when routine screening begins. A direct exam of the colon should be repeated every 5-10 years through age 75, unless early forms of precancerous polyps or small growths are found.  People who are at an increased risk for hepatitis B should be screened for this virus. You are considered at high risk for hepatitis B if:  You were born in a country where hepatitis B occurs often. Talk with your health care provider about which countries are considered high risk.  Your parents were born in a high-risk country and you have not received a shot to protect against hepatitis B (hepatitis B vaccine).  You have HIV or AIDS.  You use needles to inject street drugs.  You live with, or have sex with, someone who has hepatitis B.  You are a man who has sex with other men (MSM).  You get hemodialysis treatment.  You take certain medicines for conditions like cancer, organ  transplantation, and autoimmune conditions.  Hepatitis C blood testing is recommended for all people born from 1945 through 1965 and any individual with known risk factors for hepatitis C.  Healthy men should no longer receive prostate-specific antigen (PSA) blood tests as part of routine cancer screening. Talk to your health care provider about prostate cancer screening.  Testicular cancer screening is not recommended for adolescents or adult males who have no symptoms. Screening includes self-exam, a health care provider exam, and other screening tests. Consult with your health care provider about any symptoms you have or any concerns you have about testicular cancer.  Practice safe sex. Use condoms and avoid high-risk sexual practices to reduce the spread of sexually transmitted infections (STIs).  You should be screened for STIs, including gonorrhea and chlamydia if:  You are sexually active and are younger than 24 years.  You   are older than 24 years, and your health care provider tells you that you are at risk for this type of infection.  Your sexual activity has changed since you were last screened, and you are at an increased risk for chlamydia or gonorrhea. Ask your health care provider if you are at risk.  If you are at risk of being infected with HIV, it is recommended that you take a prescription medicine daily to prevent HIV infection. This is called pre-exposure prophylaxis (PrEP). You are considered at risk if:  You are a man who has sex with other men (MSM).  You are a heterosexual man who is sexually active with multiple partners.  You take drugs by injection.  You are sexually active with a partner who has HIV.  Talk with your health care provider about whether you are at high risk of being infected with HIV. If you choose to begin PrEP, you should first be tested for HIV. You should then be tested every 3 months for as long as you are taking PrEP.  Use sunscreen. Apply  sunscreen liberally and repeatedly throughout the day. You should seek shade when your shadow is shorter than you. Protect yourself by wearing long sleeves, pants, a wide-brimmed hat, and sunglasses year round whenever you are outdoors.  Tell your health care provider of new moles or changes in moles, especially if there is a change in shape or color. Also, tell your health care provider if a mole is larger than the size of a pencil eraser.  A one-time screening for abdominal aortic aneurysm (AAA) and surgical repair of large AAAs by ultrasound is recommended for men aged 65-75 years who are current or former smokers.  Stay current with your vaccines (immunizations).   This information is not intended to replace advice given to you by your health care provider. Make sure you discuss any questions you have with your health care provider.   Document Released: 11/21/2007 Document Revised: 06/15/2014 Document Reviewed: 10/20/2010 Elsevier Interactive Patient Education 2016 [employer]   Smoking Cessation, Tips for Success If you are ready to quit smoking, congratulations! You have chosen to help yourself be healthier. Cigarettes bring nicotine, tar, carbon monoxide, and other irritants into your body. Your lungs, heart, and blood vessels will be able to work better without these poisons. There are many different ways to quit smoking. Nicotine gum, nicotine patches, a nicotine inhaler, or nicotine nasal spray can help with physical craving. Hypnosis, support groups, and medicines help break the habit of smoking. WHAT THINGS CAN I DO TO MAKE QUITTING EASIER?  Here are some tips to help you quit for good:  Pick a date when you will quit smoking completely. Tell all of your friends and family about your plan to quit on that date.  Do not try to slowly cut down on the number of cigarettes you are smoking. Pick a quit date and quit smoking completely starting on that day.  Throw away all cigarettes.    Clean and remove all ashtrays from your home, work, and car.  On a card, write down your reasons for quitting. Carry the card with you and read it when you get the urge to smoke.  Cleanse your body of nicotine. Drink enough water and fluids to keep your urine clear or pale yellow. Do this after quitting to flush the nicotine from your body.  Learn to predict your moods. Do not let a bad situation be your excuse to have a cigarette.   Some situations in your life might tempt you into wanting a cigarette.  Never have "just one" cigarette. It leads to wanting another and another. Remind yourself of your decision to quit.  Change habits associated with smoking. If you smoked while driving or when feeling stressed, try other activities to replace smoking. Stand up when drinking your coffee. Brush your teeth after eating. Sit in a different chair when you read the paper. Avoid alcohol while trying to quit, and try to drink fewer caffeinated beverages. Alcohol and caffeine may urge you to smoke.  Avoid foods and drinks that can trigger a desire to smoke, such as sugary or spicy foods and alcohol.  Ask people who smoke not to smoke around you.  Have something planned to do right after eating or having a cup of coffee. For example, plan to take a walk or exercise.  Try a relaxation exercise to calm you down and decrease your stress. Remember, you may be tense and nervous for the first 2 weeks after you quit, but this will pass.  Find new activities to keep your hands busy. Play with a pen, coin, or rubber band. Doodle or draw things on paper.  Brush your teeth right after eating. This will help cut down on the craving for the taste of tobacco after meals. You can also try mouthwash.   Use oral substitutes in place of cigarettes. Try using lemon drops, carrots, cinnamon sticks, or chewing gum. Keep them handy so they are available when you have the urge to smoke.  When you have the urge to smoke,  try deep breathing.  Designate your home as a nonsmoking area.  If you are a heavy smoker, ask your health care provider about a prescription for nicotine chewing gum. It can ease your withdrawal from nicotine.  Reward yourself. Set aside the cigarette money you save and buy yourself something nice.  Look for support from others. Join a support group or smoking cessation program. Ask someone at home or at work to help you with your plan to quit smoking.  Always ask yourself, "Do I need this cigarette or is this just a reflex?" Tell yourself, "Today, I choose not to smoke," or "I do not want to smoke." You are reminding yourself of your decision to quit.  Do not replace cigarette smoking with electronic cigarettes (commonly called e-cigarettes). The safety of e-cigarettes is unknown, and some may contain harmful chemicals.  If you relapse, do not give up! Plan ahead and think about what you will do the next time you get the urge to smoke. HOW WILL I FEEL WHEN I QUIT SMOKING? You may have symptoms of withdrawal because your body is used to nicotine (the addictive substance in cigarettes). You may crave cigarettes, be irritable, feel very hungry, cough often, get headaches, or have difficulty concentrating. The withdrawal symptoms are only temporary. They are strongest when you first quit but will go away within 10-14 days. When withdrawal symptoms occur, stay in control. Think about your reasons for quitting. Remind yourself that these are signs that your body is healing and getting used to being without cigarettes. Remember that withdrawal symptoms are easier to treat than the major diseases that smoking can cause.  Even after the withdrawal is over, expect periodic urges to smoke. However, these cravings are generally short lived and will go away whether you smoke or not. Do not smoke! WHAT RESOURCES ARE AVAILABLE TO HELP ME QUIT SMOKING? Your health care provider can direct   you to community  resources or hospitals for support, which may include:  Group support.  Education.  Hypnosis.  Therapy.   This information is not intended to replace advice given to you by your health care provider. Make sure you discuss any questions you have with your health care provider.   Document Released: 02/21/2004 Document Revised: 06/15/2014 Document Reviewed: 11/10/2012 Elsevier Interactive Patient Education 2016 [employer]   

## 2015-07-01 NOTE — Assessment & Plan Note (Signed)
Repeat A1c drawn today. Await results.

## 2015-07-02 LAB — COMPREHENSIVE METABOLIC PANEL
A/G RATIO: 1.8 (ref 1.1–2.5)
ALBUMIN: 4.6 g/dL (ref 3.5–5.5)
ALK PHOS: 86 IU/L (ref 39–117)
ALT: 24 IU/L (ref 0–44)
AST: 22 IU/L (ref 0–40)
BILIRUBIN TOTAL: 0.5 mg/dL (ref 0.0–1.2)
BUN / CREAT RATIO: 19 (ref 9–20)
BUN: 17 mg/dL (ref 6–24)
CHLORIDE: 99 mmol/L (ref 96–106)
CO2: 23 mmol/L (ref 18–29)
Calcium: 9.9 mg/dL (ref 8.7–10.2)
Creatinine, Ser: 0.88 mg/dL (ref 0.76–1.27)
GFR calc Af Amer: 124 mL/min/{1.73_m2} (ref >59)
GFR calc non Af Amer: 107 mL/min/{1.73_m2} (ref >59)
GLOBULIN, TOTAL: 2.6 g/dL (ref 1.5–4.5)
Glucose: 93 mg/dL (ref 65–99)
Potassium: 4 mmol/L (ref 3.5–5.2)
SODIUM: 140 mmol/L (ref 134–144)
Total Protein: 7.2 g/dL (ref 6.0–8.5)

## 2015-07-02 LAB — CBC WITH DIFFERENTIAL/PLATELET
BASOS ABS: 0 10*3/uL (ref 0.0–0.2)
BASOS: 0 %
EOS (ABSOLUTE): 0.4 10*3/uL (ref 0.0–0.4)
Eos: 5 %
Hematocrit: 45.1 % (ref 37.5–51.0)
Hemoglobin: 16.1 g/dL (ref 12.6–17.7)
IMMATURE GRANULOCYTES: 0 %
Immature Grans (Abs): 0 10*3/uL (ref 0.0–0.1)
Lymphocytes Absolute: 2.2 10*3/uL (ref 0.7–3.1)
Lymphs: 27 %
MCH: 29.7 pg (ref 26.6–33.0)
MCHC: 35.7 g/dL (ref 31.5–35.7)
MCV: 83 fL (ref 79–97)
MONOS ABS: 0.7 10*3/uL (ref 0.1–0.9)
Monocytes: 8 %
NEUTROS PCT: 60 %
Neutrophils Absolute: 4.8 10*3/uL (ref 1.4–7.0)
PLATELETS: 252 10*3/uL (ref 150–379)
RBC: 5.42 x10E6/uL (ref 4.14–5.80)
RDW: 13.9 % (ref 12.3–15.4)
WBC: 8.1 10*3/uL (ref 3.4–10.8)

## 2015-07-02 LAB — TESTOSTERONE, FREE, TOTAL, SHBG
Sex Hormone Binding: 39.2 nmol/L (ref 16.5–55.9)
TESTOSTERONE: 333 ng/dL — AB (ref 348–1197)
Testosterone, Free: 10.8 pg/mL (ref 6.8–21.5)

## 2015-07-02 LAB — HIV ANTIBODY (ROUTINE TESTING W REFLEX): HIV Screen 4th Generation wRfx: NONREACTIVE

## 2015-07-02 LAB — HGB A1C W/O EAG: Hgb A1c MFr Bld: 5.7 % — ABNORMAL HIGH (ref 4.8–5.6)

## 2015-07-02 LAB — TSH: TSH: 0.907 u[IU]/mL (ref 0.450–4.500)

## 2015-07-02 LAB — VITAMIN D 25 HYDROXY (VIT D DEFICIENCY, FRACTURES): VIT D 25 HYDROXY: 33.7 ng/mL (ref 30.0–100.0)

## 2015-07-03 ENCOUNTER — Encounter: Payer: Self-pay | Admitting: Family Medicine

## 2015-08-01 ENCOUNTER — Ambulatory Visit: Payer: Managed Care, Other (non HMO) | Admitting: Family Medicine

## 2015-12-16 ENCOUNTER — Encounter: Payer: Self-pay | Admitting: Family Medicine

## 2015-12-16 ENCOUNTER — Ambulatory Visit (INDEPENDENT_AMBULATORY_CARE_PROVIDER_SITE_OTHER): Payer: Managed Care, Other (non HMO) | Admitting: Family Medicine

## 2015-12-16 VITALS — BP 127/81 | HR 68 | Temp 97.7°F | Ht 74.0 in | Wt 209.0 lb

## 2015-12-16 DIAGNOSIS — IMO0001 Reserved for inherently not codable concepts without codable children: Secondary | ICD-10-CM

## 2015-12-16 DIAGNOSIS — R5382 Chronic fatigue, unspecified: Secondary | ICD-10-CM

## 2015-12-16 DIAGNOSIS — R030 Elevated blood-pressure reading, without diagnosis of hypertension: Secondary | ICD-10-CM | POA: Diagnosis not present

## 2015-12-16 NOTE — Patient Instructions (Signed)
DASH Eating Plan  DASH stands for "Dietary Approaches to Stop Hypertension." The DASH eating plan is a healthy eating plan that has been shown to reduce high blood pressure (hypertension). Additional health benefits may include reducing the risk of type 2 diabetes mellitus, heart disease, and stroke. The DASH eating plan may also help with weight loss.  WHAT DO I NEED TO KNOW ABOUT THE DASH EATING PLAN?  For the DASH eating plan, you will follow these general guidelines:  · Choose foods with a percent daily value for sodium of less than 5% (as listed on the food label).  · Use salt-free seasonings or herbs instead of table salt or sea salt.  · Check with your health care provider or pharmacist before using salt substitutes.  · Eat lower-sodium products, often labeled as "lower sodium" or "no salt added."  · Eat fresh foods.  · Eat more vegetables, fruits, and low-fat dairy products.  · Choose whole grains. Look for the word "whole" as the first word in the ingredient list.  · Choose fish and skinless chicken or turkey more often than red meat. Limit fish, poultry, and meat to 6 oz (170 g) each day.  · Limit sweets, desserts, sugars, and sugary drinks.  · Choose heart-healthy fats.  · Limit cheese to 1 oz (28 g) per day.  · Eat more home-cooked food and less restaurant, buffet, and fast food.  · Limit fried foods.  · Cook foods using methods other than frying.  · Limit canned vegetables. If you do use them, rinse them well to decrease the sodium.  · When eating at a restaurant, ask that your food be prepared with less salt, or no salt if possible.  WHAT FOODS CAN I EAT?  Seek help from a dietitian for individual calorie needs.  Grains  Whole grain or whole wheat bread. Brown rice. Whole grain or whole wheat pasta. Quinoa, bulgur, and whole grain cereals. Low-sodium cereals. Corn or whole wheat flour tortillas. Whole grain cornbread. Whole grain crackers. Low-sodium crackers.  Vegetables  Fresh or frozen vegetables  (raw, steamed, roasted, or grilled). Low-sodium or reduced-sodium tomato and vegetable juices. Low-sodium or reduced-sodium tomato sauce and paste. Low-sodium or reduced-sodium canned vegetables.   Fruits  All fresh, canned (in natural juice), or frozen fruits.  Meat and Other Protein Products  Ground beef (85% or leaner), grass-fed beef, or beef trimmed of fat. Skinless chicken or turkey. Ground chicken or turkey. Pork trimmed of fat. All fish and seafood. Eggs. Dried beans, peas, or lentils. Unsalted nuts and seeds. Unsalted canned beans.  Dairy  Low-fat dairy products, such as skim or 1% milk, 2% or reduced-fat cheeses, low-fat ricotta or cottage cheese, or plain low-fat yogurt. Low-sodium or reduced-sodium cheeses.  Fats and Oils  Tub margarines without trans fats. Light or reduced-fat mayonnaise and salad dressings (reduced sodium). Avocado. Safflower, olive, or canola oils. Natural peanut or almond butter.  Other  Unsalted popcorn and pretzels.  The items listed above may not be a complete list of recommended foods or beverages. Contact your dietitian for more options.  WHAT FOODS ARE NOT RECOMMENDED?  Grains  White bread. White pasta. White rice. Refined cornbread. Bagels and croissants. Crackers that contain trans fat.  Vegetables  Creamed or fried vegetables. Vegetables in a cheese sauce. Regular canned vegetables. Regular canned tomato sauce and paste. Regular tomato and vegetable juices.  Fruits  Dried fruits. Canned fruit in light or heavy syrup. Fruit juice.  Meat and Other Protein   Products  Fatty cuts of meat. Ribs, chicken wings, bacon, sausage, bologna, salami, chitterlings, fatback, hot dogs, bratwurst, and packaged luncheon meats. Salted nuts and seeds. Canned beans with salt.  Dairy  Whole or 2% milk, cream, half-and-half, and cream cheese. Whole-fat or sweetened yogurt. Full-fat cheeses or blue cheese. Nondairy creamers and whipped toppings. Processed cheese, cheese spreads, or cheese  curds.  Condiments  Onion and garlic salt, seasoned salt, table salt, and sea salt. Canned and packaged gravies. Worcestershire sauce. Tartar sauce. Barbecue sauce. Teriyaki sauce. Soy sauce, including reduced sodium. Steak sauce. Fish sauce. Oyster sauce. Cocktail sauce. Horseradish. Ketchup and mustard. Meat flavorings and tenderizers. Bouillon cubes. Hot sauce. Tabasco sauce. Marinades. Taco seasonings. Relishes.  Fats and Oils  Butter, stick margarine, lard, shortening, ghee, and bacon fat. Coconut, palm kernel, or palm oils. Regular salad dressings.  Other  Pickles and olives. Salted popcorn and pretzels.  The items listed above may not be a complete list of foods and beverages to avoid. Contact your dietitian for more information.  WHERE CAN I FIND MORE INFORMATION?  [employer]: www.nhlbi.nih.gov/health/health-topics/topics/dash/     This information is not intended to replace advice given to you by your health care provider. Make sure you discuss any questions you have with your health care provider.     Document Released: 05/14/2011 Document Revised: 06/15/2014 Document Reviewed: 03/29/2013  Elsevier Interactive Patient Education ©2016 [employer]
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
## 2016-03-12 ENCOUNTER — Other Ambulatory Visit: Payer: Self-pay | Admitting: Family Medicine

## 2016-07-16 IMAGING — CR DG FINGER INDEX 2+V*L*
3 series · 3 of 3 positions shown · non-contrast
Comparison: None.

CLINICAL DATA: Recent removal of splinter lateral to PIP joint. Two
day history of swelling in the PIP joint region

EXAM:
LEFT SECOND FINGER 2+V

[finger ap]
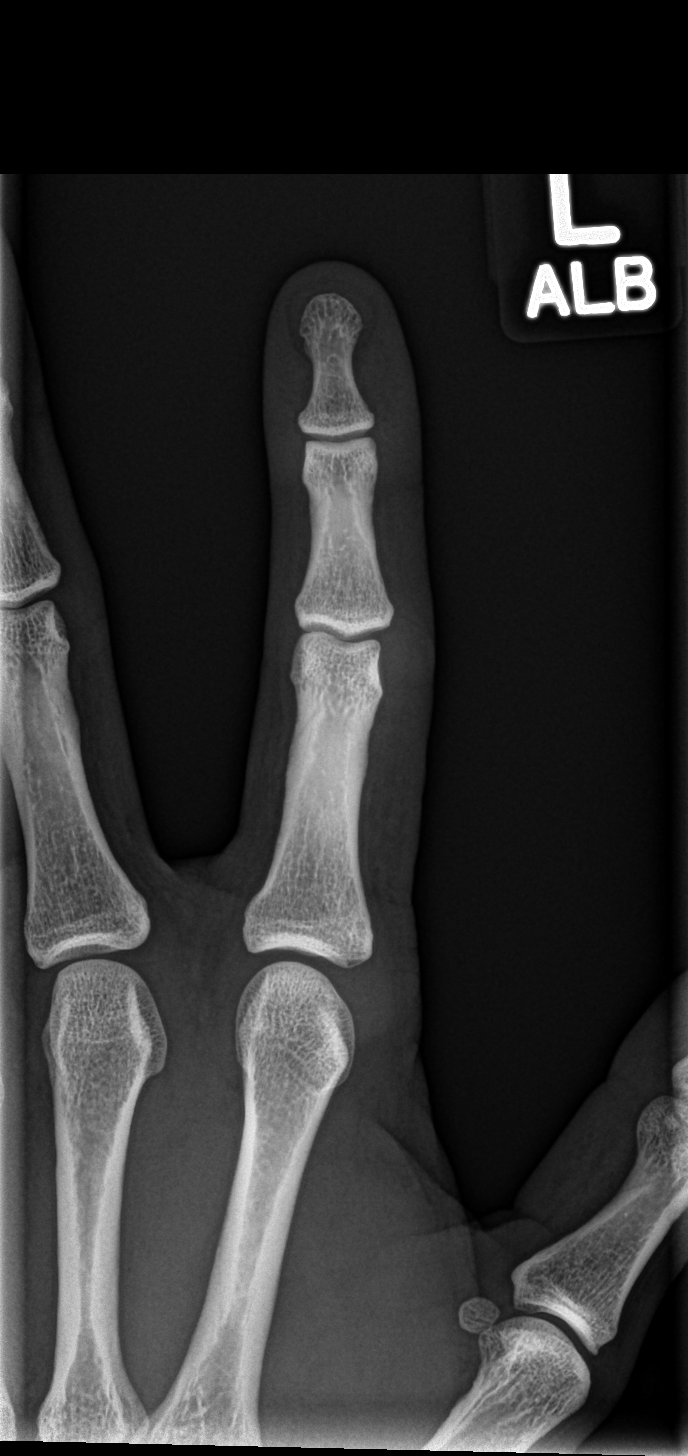

[finger obl]
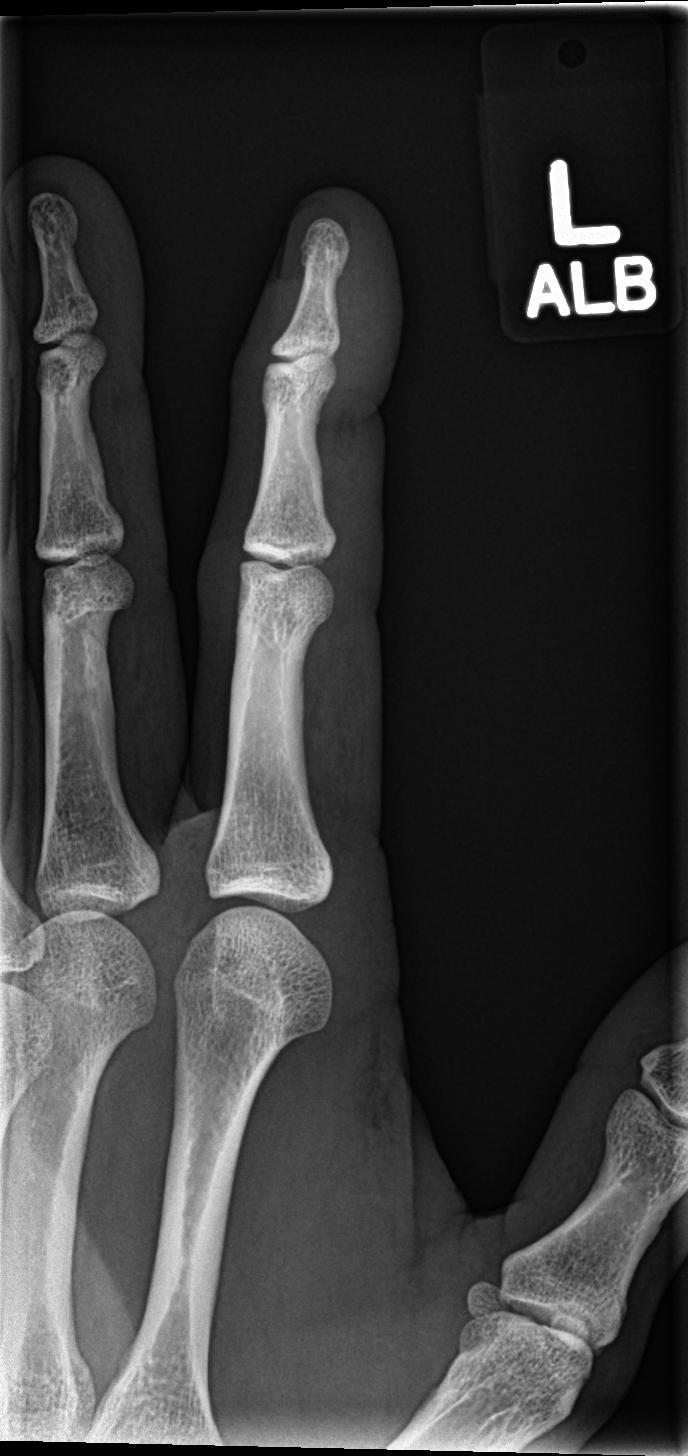

[finger lat]
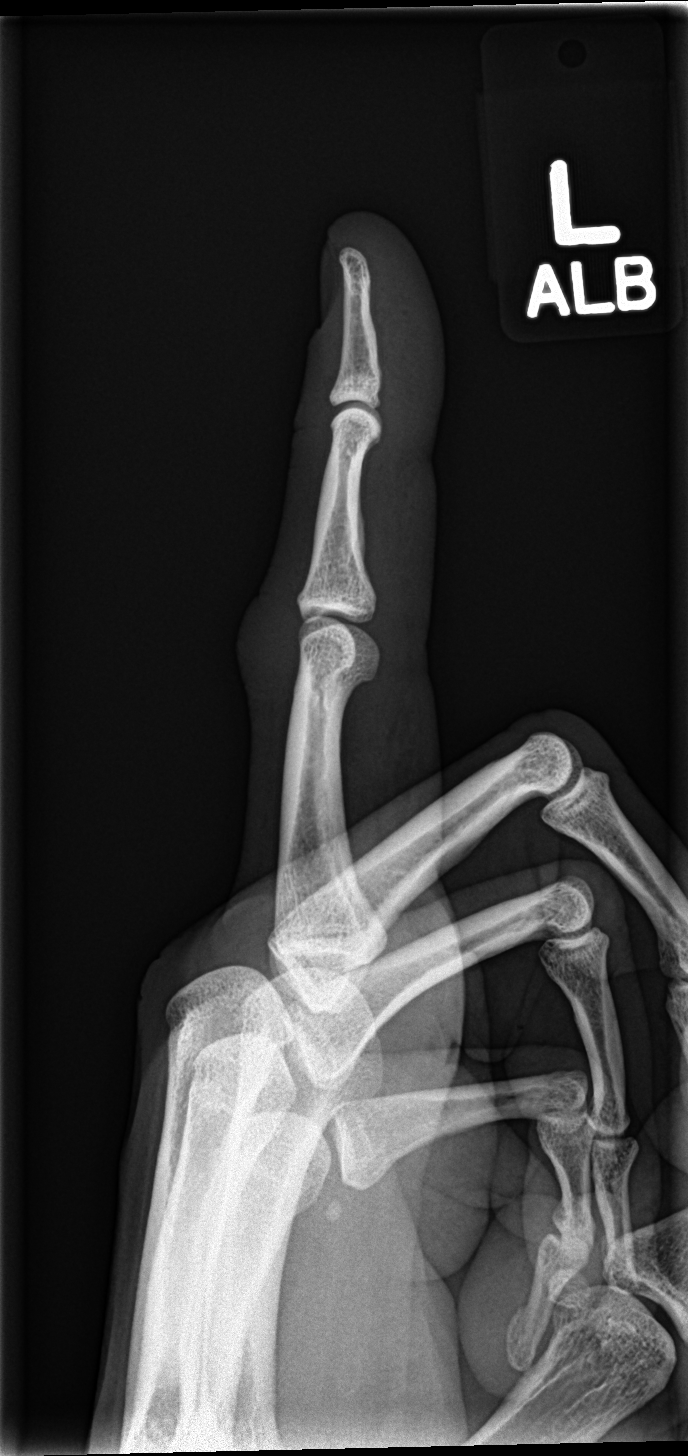

[3 of 3 positions shown; findings below may reference images not displayed]

FINDINGS: Frontal, oblique, and lateral views were obtained. There is soft
tissue swelling in the region of the second PIP joint, most markedly
along the dorsal aspect. No radiopaque foreign body is appreciated.
No fracture or dislocation. Joint spaces appear intact. No erosive
change or bony destruction.
IMPRESSION: Soft tissue swelling in the region of the second PIP joint,
particularly along the dorsal aspect. No bony abnormality. No
radiopaque foreign body. Joint spaces appear normal.

## 2016-07-20 ENCOUNTER — Ambulatory Visit: Payer: Managed Care, Other (non HMO) | Admitting: Family Medicine

## 2016-08-13 ENCOUNTER — Encounter: Payer: Self-pay | Admitting: Family Medicine

## 2016-08-13 ENCOUNTER — Ambulatory Visit (INDEPENDENT_AMBULATORY_CARE_PROVIDER_SITE_OTHER): Payer: Managed Care, Other (non HMO) | Admitting: Family Medicine

## 2016-08-13 VITALS — BP 152/97 | HR 73 | Temp 98.3°F | Ht 74.0 in | Wt 219.0 lb

## 2016-08-13 DIAGNOSIS — S6992XA Unspecified injury of left wrist, hand and finger(s), initial encounter: Secondary | ICD-10-CM | POA: Diagnosis not present

## 2016-08-13 MED ORDER — MELOXICAM 15 MG PO TABS: 15.0000 mg | ORAL_TABLET | Freq: Every day | ORAL | 0 refills | Status: DC

## 2016-08-13 NOTE — Patient Instructions (Signed)
Follow up as needed

## 2016-08-13 NOTE — Progress Notes (Signed)
   BP (!) 152/97   Pulse 73   Temp 98.3 F (36.8 C)   Ht 6\' 2"  (1.88 m)   Wt 219 lb (99.3 kg)   SpO2 97%   BMI 28.12 kg/m    Subjective:    Patient ID: Shawn Andrade, male    DOB: 08/17/1975, 41 y.o.   MRN: [id number]  HPI: Shawn Andrade is a 41 y.o. male  Chief Complaint  Patient presents with  . Finger Injury    slammed his left pointer finger in a storm door today. Throbbing pain.    Patient presents with left 2nd digit pain following an incident slamming it in a storm door about an hour ago. Has had significant throbbing pain and an area of bruising under the fingernail. Has not tried taking anything for pain or tried any ice at this time. Denies immobility, numbness, bleeding from the area.   Relevant past medical, surgical, family and social history reviewed and updated as indicated. Interim medical history since our last visit reviewed. Allergies and medications reviewed and updated.  Review of Systems  Constitutional: Negative.   Respiratory: Negative.   Cardiovascular: Negative.   Musculoskeletal: Positive for arthralgias and joint swelling.  Neurological: Negative.   Psychiatric/Behavioral: Negative.     Per HPI unless specifically indicated above     Objective:    BP (!) 152/97   Pulse 73   Temp 98.3 F (36.8 C)   Ht 6\' 2"  (1.88 m)   Wt 219 lb (99.3 kg)   SpO2 97%   BMI 28.12 kg/m   Wt Readings from Last 3 Encounters:  08/13/16 219 lb (99.3 kg)  12/16/15 209 lb (94.8 kg)  07/01/15 203 lb (92.1 kg)    Physical Exam  Constitutional: He is oriented to person, place, and time. He appears well-developed and well-nourished.  HENT:  Head: Atraumatic.  Eyes: Conjunctivae are normal. Pupils are equal, round, and reactive to light.  Neck: Normal range of motion. Neck supple.  Cardiovascular: Normal rate, normal heart sounds and intact distal pulses.   Pulmonary/Chest: Effort normal. No respiratory distress.  Musculoskeletal: Normal range of motion. He  exhibits edema (left pointer finger mildly edematous at distal end) and tenderness (left second digit severely ttp).  Neurological: He is alert and oriented to person, place, and time.  Left second finger neurovascularly intact  Skin: Skin is warm and dry.  Contusion present under left 2nd fingernail  Psychiatric: He has a normal mood and affect. His behavior is normal.  Nursing note and vitals reviewed.     Assessment & Plan:   Problem List Items Addressed This Visit    None    Visit Diagnoses    Injury of finger of left hand, initial encounter    -  Primary    Used shared decision making - will hold off on x-ray at this time as well as attempting to drain blood under nail as it appears coagulated.  Ice, tylenol and meloxicam for pain relief. Return precautions given.   Follow up plan: Return if symptoms worsen or fail to improve.

## 2016-09-07 ENCOUNTER — Telehealth: Payer: Self-pay | Admitting: Family Medicine

## 2016-09-07 MED ORDER — FLUTICASONE PROPIONATE 50 MCG/ACT NA SUSP: 2.0000 | Freq: Every day | NASAL | 6 refills | Status: DC

## 2016-09-07 NOTE — Telephone Encounter (Signed)
Routing to provider  

## 2016-11-07 ENCOUNTER — Other Ambulatory Visit: Payer: Self-pay | Admitting: Family Medicine

## 2016-12-08 ENCOUNTER — Other Ambulatory Visit: Payer: Self-pay | Admitting: Family Medicine

## 2017-01-13 ENCOUNTER — Other Ambulatory Visit: Payer: Self-pay | Admitting: Family Medicine

## 2017-01-29 ENCOUNTER — Ambulatory Visit (INDEPENDENT_AMBULATORY_CARE_PROVIDER_SITE_OTHER): Payer: PRIVATE HEALTH INSURANCE | Admitting: Family Medicine

## 2017-01-29 VITALS — BP 131/88 | HR 68 | Ht 75.0 in | Wt 210.0 lb

## 2017-01-29 DIAGNOSIS — Z0000 Encounter for general adult medical examination without abnormal findings: Secondary | ICD-10-CM

## 2017-01-29 DIAGNOSIS — R7301 Impaired fasting glucose: Secondary | ICD-10-CM

## 2017-01-29 DIAGNOSIS — Z720 Tobacco use: Secondary | ICD-10-CM

## 2017-01-29 DIAGNOSIS — F411 Generalized anxiety disorder: Secondary | ICD-10-CM

## 2017-01-29 LAB — UA/M W/RFLX CULTURE, ROUTINE
Bilirubin, UA: NEGATIVE
Glucose, UA: NEGATIVE
KETONES UA: NEGATIVE
LEUKOCYTES UA: NEGATIVE
NITRITE UA: NEGATIVE
PH UA: 5.5 (ref 5.0–7.5)
Protein, UA: NEGATIVE
RBC, UA: NEGATIVE
Specific Gravity, UA: 1.025 (ref 1.005–1.030)
Urobilinogen, Ur: 0.2 mg/dL (ref 0.2–1.0)

## 2017-01-29 LAB — BAYER DCA HB A1C WAIVED: HB A1C (BAYER DCA - WAIVED): 4.7 % (ref <7.0)

## 2017-01-29 LAB — MICROSCOPIC EXAMINATION
BACTERIA UA: NONE SEEN
RBC MICROSCOPIC, UA: NONE SEEN /HPF (ref 0–?)

## 2017-01-29 MED ORDER — SERTRALINE HCL 100 MG PO TABS: 100.0000 mg | ORAL_TABLET | Freq: Every day | ORAL | 4 refills | Status: DC

## 2017-01-29 MED ORDER — BUPROPION HCL ER (SR) 150 MG PO TB12: ORAL_TABLET | ORAL | 3 refills | Status: DC

## 2017-01-29 MED ORDER — LORAZEPAM 1 MG PO TABS: ORAL_TABLET | ORAL | 0 refills | Status: DC

## 2017-01-29 NOTE — Assessment & Plan Note (Signed)
Would like to quit- Rx for wellbutrin given today.

## 2017-01-29 NOTE — Progress Notes (Signed)
BP 131/88   Pulse 68   Ht 6\' 3"  (1.905 m)   Wt 210 lb (95.3 kg)   SpO2 96%   BMI 26.25 kg/m    Subjective:    Patient ID: Shawn Andrade, male    DOB: [birth year], 41 y.o.   MRN: [id number]  HPI: Shawn Andrade is a 41 y.o. male presenting on 01/29/2017 for comprehensive medical examination. Current medical complaints include:  ANXIETY/STRESS Duration:controlled Anxious mood: no  Excessive worrying: no Irritability: no  Sweating: no Nausea: no Palpitations:no Hyperventilation: no Panic attacks: no Agoraphobia: no  Obscessions/compulsions: no Depressed mood: no Depression screen [facility] 2/9 07/01/2015 11/22/2014  Decreased Interest 0 0  Down, Depressed, Hopeless 0 0  PHQ - 2 Score 0 0  Altered sleeping - 0  Tired, decreased energy - 1  Change in appetite - 0  Feeling bad or failure about yourself  - 0  Trouble concentrating - 0  Moving slowly or fidgety/restless - 0  Suicidal thoughts - 0  PHQ-9 Score - 1  Difficult doing work/chores - Not difficult at all   Anhedonia: no Weight changes: no Insomnia: no   Hypersomnia: no Fatigue/loss of energy: no Feelings of worthlessness: no Feelings of guilt: no Impaired concentration/indecisiveness: no Suicidal ideations: no  Crying spells: no Recent Stressors/Life Changes: no  Impaired Fasting Glucose HbA1C:  Lab Results  Component Value Date   HGBA1C CANCELED 07/01/2015   Duration of elevated blood sugar: chronic Polydipsia: no Polyuria: no Weight change: no Visual disturbance: no Glucose Monitoring: no  He currently lives with: wife and daughters Interim Problems from his last visit: no   Past Medical History:  Past Medical History:  Diagnosis Date  . Allergy   . Anxiety   . Elevated blood pressure   . IFG (impaired fasting glucose)     Surgical History:  Past Surgical History:  Procedure Laterality Date  . NO PAST SURGERIES      Medications:  Current Outpatient Prescriptions on File Prior to  Visit  Medication Sig  . fluticasone (FLONASE) 50 MCG/ACT nasal spray Place 2 sprays into both nostrils daily.  . meloxicam (MOBIC) 15 MG tablet Take 1 tablet (15 mg total) by mouth daily.   No current facility-administered medications on file prior to visit.     Allergies:  No Known Allergies  Social History:  Social History   Social History  . Marital status: Married    Spouse name: N/A  . Number of children: 2  . Years of education: N/A   Occupational History  . [employer]   Social History Main Topics  . Smoking status: Current Every Day Smoker    Packs/day: 1.00    Years: 15.00    Types: Cigarettes  . Smokeless tobacco: Current User    Types: Snuff  . Alcohol use 0.0 oz/week     Comment: one or less per day, rare  . Drug use: No  . Sexual activity: Yes   Other Topics Concern  . Not on file   Social History Narrative  . No narrative on file   History  Smoking Status  . Current Every Day Smoker  . Packs/day: 1.00  . Years: 15.00  . Types: Cigarettes  Smokeless Tobacco  . Current User  . Types: Snuff   History  Alcohol Use  . 0.0 oz/week    Comment: one or less per day, rare    Family History:  Family History  Problem Relation Age of  Onset  . Hypertension Father   . Hypertension Mother   . Anxiety disorder Mother   . Cancer Paternal Grandfather     Past medical history, surgical history, medications, allergies, family history and social history reviewed with patient today and changes made to appropriate areas of the chart.   Review of Systems  Constitutional: Negative.   HENT: Negative.   Eyes: Negative.   Respiratory: Positive for cough and shortness of breath. Negative for hemoptysis, sputum production and wheezing.   Cardiovascular: Negative.   Gastrointestinal: Positive for heartburn. Negative for abdominal pain, blood in stool, constipation, diarrhea, melena, nausea and vomiting.  Genitourinary: Negative.     Musculoskeletal: Negative.   Skin: Negative.   Neurological: Negative.   Endo/Heme/Allergies: Negative.   Psychiatric/Behavioral: Negative.     All other ROS negative except what is listed above and in the HPI.      Objective:    BP 131/88   Pulse 68   Ht 6\' 3"  (1.905 m)   Wt 210 lb (95.3 kg)   SpO2 96%   BMI 26.25 kg/m   Wt Readings from Last 3 Encounters:  01/29/17 210 lb (95.3 kg)  08/13/16 219 lb (99.3 kg)  12/16/15 209 lb (94.8 kg)    Physical Exam  Constitutional: He is oriented to person, place, and time. He appears well-developed and well-nourished. No distress.  HENT:  Head: Normocephalic and atraumatic.  Right Ear: Hearing, tympanic membrane, external ear and ear canal normal.  Left Ear: Hearing, tympanic membrane, external ear and ear canal normal.  Nose: Nose normal.  Mouth/Throat: Uvula is midline, oropharynx is clear and moist and mucous membranes are normal. No oropharyngeal exudate.  Eyes: Pupils are equal, round, and reactive to light. Conjunctivae, EOM and lids are normal. Right eye exhibits no discharge. Left eye exhibits no discharge. No scleral icterus.  Neck: Normal range of motion. Neck supple. No JVD present. No tracheal deviation present. No thyromegaly present.  Cardiovascular: Normal rate, regular rhythm, normal heart sounds and intact distal pulses.  Exam reveals no gallop and no friction rub.   No murmur heard. Pulmonary/Chest: Effort normal and breath sounds normal. No stridor. No respiratory distress. He has no wheezes. He has no rales. He exhibits no tenderness.  Abdominal: Soft. Bowel sounds are normal. He exhibits no distension and no mass. There is no tenderness. There is no rebound and no guarding. Hernia confirmed negative in the right inguinal area and confirmed negative in the left inguinal area.  Genitourinary: Testes normal and penis normal. Cremasteric reflex is present. Right testis shows no mass, no swelling and no tenderness. Right  testis is descended. Cremasteric reflex is not absent on the right side. Left testis shows no mass, no swelling and no tenderness. Left testis is descended. Cremasteric reflex is not absent on the left side. Circumcised. No phimosis, paraphimosis, hypospadias, penile erythema or penile tenderness. No discharge found.  Musculoskeletal: Normal range of motion. He exhibits no edema, tenderness or deformity.  Lymphadenopathy:    He has no cervical adenopathy.  Neurological: He is alert and oriented to person, place, and time. He has normal reflexes. He displays normal reflexes. No cranial nerve deficit. He exhibits normal muscle tone. Coordination normal.  Skin: Skin is warm, dry and intact. No rash noted. He is not diaphoretic. No erythema. No pallor.  Psychiatric: He has a normal mood and affect. His speech is normal and behavior is normal. Judgment and thought content normal. Cognition and memory are normal.  Nursing  note and vitals reviewed.   Results for orders placed or performed in visit on 07/01/15  Lipid Panel Piccolo, [employer]  Result Value Ref Range   Cholesterol Piccolo, Waived 218 (H) <200 mg/dL   HDL Chol Piccolo, Waived 57 (L) >59 mg/dL   Triglycerides Piccolo,Waived 260 (H) <150 mg/dL   Chol/HDL Ratio Piccolo,Waive 3.8 mg/dL   LDL Chol Calc Piccolo Waived 109 (H) <100 mg/dL   VLDL Chol Calc Piccolo,Waive 52 (H) <30 mg/dL  Vit D  25 hydroxy (rtn osteoporosis monitoring)  Result Value Ref Range   Vit D, 25-Hydroxy 33.7 30.0 - 100.0 ng/mL  TSH  Result Value Ref Range   TSH 0.907 0.450 - 4.500 uIU/mL  Microalbumin, Urine Waived  Result Value Ref Range   Microalb, Ur Waived 80 (H) 0 - 19 mg/L   Creatinine, Urine Waived 300 10 - 300 mg/dL   Microalb/Creat Ratio 30-300 (H) <30 mg/g  Testosterone, free, total  Result Value Ref Range   Testosterone 333 (L) 348 - 1,197 ng/dL   Comment, Testosterone Comment    Testosterone, Free 10.8 6.8 - 21.5 pg/mL   Sex Hormone Binding 39.2 16.5  - 55.9 nmol/L  Comprehensive metabolic panel  Result Value Ref Range   Glucose 93 65 - 99 mg/dL   BUN 17 6 - 24 mg/dL   Creatinine, Ser 1.61 0.76 - 1.27 mg/dL   GFR calc non Af Amer 107 >59 mL/min/1.73   GFR calc Af Amer 124 >59 mL/min/1.73   BUN/Creatinine Ratio 19 9 - 20   Sodium 140 134 - 144 mmol/L   Potassium 4.0 3.5 - 5.2 mmol/L   Chloride 99 96 - 106 mmol/L   CO2 23 18 - 29 mmol/L   Calcium 9.9 8.7 - 10.2 mg/dL   Total Protein 7.2 6.0 - 8.5 g/dL   Albumin 4.6 3.5 - 5.5 g/dL   Globulin, Total 2.6 1.5 - 4.5 g/dL   Albumin/Globulin Ratio 1.8 1.1 - 2.5   Bilirubin Total 0.5 0.0 - 1.2 mg/dL   Alkaline Phosphatase 86 39 - 117 IU/L   AST 22 0 - 40 IU/L   ALT 24 0 - 44 IU/L  HIV antibody  Result Value Ref Range   HIV Screen 4th Generation wRfx CANCELED   Hgb A1c w/o eAG  Result Value Ref Range   Hgb A1c MFr Bld CANCELED %  CBC With Differential/Platelet  Result Value Ref Range   WBC 8.4 3.4 - 10.8 x10E3/uL   RBC 5.53 4.14 - 5.80 x10E6/uL   Hemoglobin 16.9 12.6 - 17.7 g/dL   Hematocrit 09.6 04.5 - 51.0 %   MCV 84 79 - 97 fL   MCH 30.6 26.6 - 33.0 pg   MCHC 36.3 (H) 31.5 - 35.7 g/dL   RDW 40.9 81.1 - 91.4 %   Platelets 271 150 - 379 x10E3/uL   Neutrophils 63 %   Lymphs 29 %   MID 8 %   Neutrophils Absolute 5.3 1.4 - 7.0 x10E3/uL   Lymphocytes Absolute 2.4 0.7 - 3.1 x10E3/uL   MID (Absolute) 0.7 0.1 - 1.6 X10E3/uL  CBC with Differential/Platelet  Result Value Ref Range   WBC 8.1 3.4 - 10.8 x10E3/uL   RBC 5.42 4.14 - 5.80 x10E6/uL   Hemoglobin 16.1 12.6 - 17.7 g/dL   Hematocrit 78.2 95.6 - 51.0 %   MCV 83 79 - 97 fL   MCH 29.7 26.6 - 33.0 pg   MCHC 35.7 31.5 - 35.7 g/dL   RDW 21.3 08.6 -  15.4 %   Platelets 252 150 - 379 x10E3/uL   Neutrophils 60 %   Lymphs 27 %   Monocytes 8 %   Eos 5 %   Basos 0 %   Neutrophils Absolute 4.8 1.4 - 7.0 x10E3/uL   Lymphocytes Absolute 2.2 0.7 - 3.1 x10E3/uL   Monocytes Absolute 0.7 0.1 - 0.9 x10E3/uL   EOS (ABSOLUTE) 0.4 0.0 -  0.4 x10E3/uL   Basophils Absolute 0.0 0.0 - 0.2 x10E3/uL   Immature Granulocytes 0 %   Immature Grans (Abs) 0.0 0.0 - 0.1 x10E3/uL  HIV antibody  Result Value Ref Range   HIV Screen 4th Generation wRfx Non Reactive Non Reactive  Hgb A1c w/o eAG  Result Value Ref Range   Hgb A1c MFr Bld 5.7 (H) 4.8 - 5.6 %      Assessment & Plan:   Problem List Items Addressed This Visit      Endocrine   RESOLVED: IFG (impaired fasting glucose)    Under good control with A1c of 4.7. Will resolve off his problem list.       Relevant Orders   Bayer DCA Hb A1c Waived     Other   Anxiety disorder    Under good control. Continue current regimen. Continue to monitor. Call with any concerns.       Relevant Orders   TSH   Tobacco abuse    Would like to quit- Rx for wellbutrin given today.       Other Visit Diagnoses    Routine general medical examination at a health care facility    -  Primary   Vaccines up to date. Screening labs checked today. Continue diet and exercise. Call with any concerns.    Relevant Orders   Bayer DCA Hb A1c Waived   CBC with Differential/Platelet   Comprehensive metabolic panel   Lipid Panel w/o Chol/HDL Ratio   TSH   UA/M w/rflx Culture, Routine      Discussed aspirin prophylaxis for myocardial infarction prevention and decision was it was not indicated  LABORATORY TESTING:  Health maintenance labs ordered today as discussed above.   IMMUNIZATIONS:   - Tdap: Tetanus vaccination status reviewed: last tetanus booster within 10 years. - Influenza: Postponed to flu season - Pneumovax: Refused  PATIENT COUNSELING:    Sexuality: Discussed sexually transmitted diseases, partner selection, use of condoms, avoidance of unintended pregnancy  and contraceptive alternatives.   Advised to avoid cigarette smoking.  I discussed with the patient that most people either abstain from alcohol or drink within safe limits (<=14/week and <=4 drinks/occasion for males,  <=7/weeks and <= 3 drinks/occasion for females) and that the risk for alcohol disorders and other health effects rises proportionally with the number of drinks per week and how often a drinker exceeds daily limits.  Discussed cessation/primary prevention of drug use and availability of treatment for abuse.   Diet: Encouraged to adjust caloric intake to maintain  or achieve ideal body weight, to reduce intake of dietary saturated fat and total fat, to limit sodium intake by avoiding high sodium foods and not adding table salt, and to maintain adequate dietary potassium and calcium preferably from fresh fruits, vegetables, and low-fat dairy products.    stressed the importance of regular exercise  Injury prevention: Discussed safety belts, safety helmets, smoke detector, smoking near bedding or upholstery.   Dental health: Discussed importance of regular tooth brushing, flossing, and dental visits.   Follow up plan: NEXT PREVENTATIVE PHYSICAL DUE IN 1  YEAR. Return in about 1 year (around 01/29/2018) for Physical.

## 2017-01-29 NOTE — Assessment & Plan Note (Signed)
Under good control. Continue current regimen. Continue to monitor. Call with any concerns. 

## 2017-01-29 NOTE — Assessment & Plan Note (Signed)
Under good control with A1c of 4.7. Will resolve off his problem list.

## 2017-01-30 LAB — LIPID PANEL W/O CHOL/HDL RATIO
CHOLESTEROL TOTAL: 205 mg/dL — AB (ref 100–199)
HDL: 46 mg/dL (ref >39)
LDL Calculated: 124 mg/dL — ABNORMAL HIGH (ref 0–99)
Triglycerides: 173 mg/dL — ABNORMAL HIGH (ref 0–149)
VLDL CHOLESTEROL CAL: 35 mg/dL (ref 5–40)

## 2017-01-30 LAB — CBC WITH DIFFERENTIAL/PLATELET
Basophils Absolute: 0 10*3/uL (ref 0.0–0.2)
Basos: 0 %
EOS (ABSOLUTE): 0.4 10*3/uL (ref 0.0–0.4)
EOS: 6 %
Hematocrit: 43.6 % (ref 37.5–51.0)
Hemoglobin: 15 g/dL (ref 13.0–17.7)
IMMATURE GRANULOCYTES: 0 %
Immature Grans (Abs): 0 10*3/uL (ref 0.0–0.1)
LYMPHS: 32 %
Lymphocytes Absolute: 2.1 10*3/uL (ref 0.7–3.1)
MCH: 29 pg (ref 26.6–33.0)
MCHC: 34.4 g/dL (ref 31.5–35.7)
MCV: 84 fL (ref 79–97)
MONOCYTES: 7 %
MONOS ABS: 0.5 10*3/uL (ref 0.1–0.9)
Neutrophils Absolute: 3.6 10*3/uL (ref 1.4–7.0)
Neutrophils: 55 %
PLATELETS: 252 10*3/uL (ref 150–379)
RBC: 5.18 x10E6/uL (ref 4.14–5.80)
RDW: 13.4 % (ref 12.3–15.4)
WBC: 6.7 10*3/uL (ref 3.4–10.8)

## 2017-01-30 LAB — COMPREHENSIVE METABOLIC PANEL
ALBUMIN: 4.5 g/dL (ref 3.5–5.5)
ALK PHOS: 91 IU/L (ref 39–117)
ALT: 20 IU/L (ref 0–44)
AST: 19 IU/L (ref 0–40)
Albumin/Globulin Ratio: 1.6 (ref 1.2–2.2)
BILIRUBIN TOTAL: 0.5 mg/dL (ref 0.0–1.2)
BUN / CREAT RATIO: 21 — AB (ref 9–20)
BUN: 17 mg/dL (ref 6–24)
CHLORIDE: 99 mmol/L (ref 96–106)
CO2: 21 mmol/L (ref 20–29)
Calcium: 9.8 mg/dL (ref 8.7–10.2)
Creatinine, Ser: 0.8 mg/dL (ref 0.76–1.27)
GFR calc Af Amer: 127 mL/min/{1.73_m2} (ref >59)
GFR calc non Af Amer: 110 mL/min/{1.73_m2} (ref >59)
GLUCOSE: 91 mg/dL (ref 65–99)
Globulin, Total: 2.8 g/dL (ref 1.5–4.5)
POTASSIUM: 4.2 mmol/L (ref 3.5–5.2)
SODIUM: 138 mmol/L (ref 134–144)
Total Protein: 7.3 g/dL (ref 6.0–8.5)

## 2017-01-30 LAB — TSH: TSH: 0.694 u[IU]/mL (ref 0.450–4.500)

## 2017-02-02 ENCOUNTER — Other Ambulatory Visit: Payer: Self-pay | Admitting: Family Medicine

## 2017-02-25 ENCOUNTER — Other Ambulatory Visit: Payer: Self-pay | Admitting: Family Medicine

## 2017-03-08 ENCOUNTER — Other Ambulatory Visit: Payer: Self-pay | Admitting: Family Medicine

## 2017-03-09 NOTE — Telephone Encounter (Signed)
I've refilled this for a year in August- can you please make sure they got that Rx

## 2017-03-09 NOTE — Telephone Encounter (Signed)
Spoke with pharmacy, medication was on hold, will fill.

## 2017-04-14 ENCOUNTER — Encounter: Payer: Self-pay | Admitting: *Deleted

## 2017-04-14 ENCOUNTER — Ambulatory Visit
Admission: EM | Admit: 2017-04-14 | Discharge: 2017-04-14 | Disposition: A | Discharge status: Home / Self Care | Payer: PRIVATE HEALTH INSURANCE | Attending: Family Medicine | Admitting: Family Medicine

## 2017-04-14 DIAGNOSIS — J029 Acute pharyngitis, unspecified: Secondary | ICD-10-CM

## 2017-04-14 LAB — RAPID STREP SCREEN (MED CTR MEBANE ONLY): Streptococcus, Group A Screen (Direct): NEGATIVE

## 2017-04-14 MED ORDER — AMOXICILLIN 875 MG PO TABS: 875.0000 mg | ORAL_TABLET | Freq: Two times a day (BID) | ORAL | 0 refills | Status: DC

## 2017-04-14 NOTE — Discharge Instructions (Signed)
Take medication as prescribed. Rest. Drink plenty of fluids.  ° °Follow up with your primary care physician this week as needed. Return to Urgent care for new or worsening concerns.  ° °

## 2017-04-14 NOTE — ED Triage Notes (Signed)
Sore throat onset this am. Denies other symptoms.

## 2017-04-14 NOTE — ED Provider Notes (Signed)
[facility] ____________________________________________  Time seen: Approximately 8:56 AM  I have reviewed the triage vital signs and the nursing notes.   HISTORY  Chief Complaint Sore Throat  HPI Shawn Andrade is a 41 y.o. male presenting for evaluation of sore throat present upon awakening early this morning.  States sore throat moderate this time.  States able to continue swallowing, but feels like he has swelling in his throat.  Denies accompanying runny nose, nasal congestion, known fevers.  Reports some body aches.  Denies known sick contacts, but does work with the public.  Denies recent sickness or recent changes.  Has not taking medications prior to arrival.  Denies other aggravating or alleviating factors.  Denies chest pain, shortness of breath, abdominal pain, or rash. Denies recent sickness. Denies recent antibiotic use.   Dorcas Carrow, DO: PCP   Past Medical History:  Diagnosis Date  . Allergy   . Anxiety   . Elevated blood pressure   . IFG (impaired fasting glucose)     Patient Active Problem List   Diagnosis Date Noted  . Tobacco abuse 01/29/2017  . Controlled substance agreement signed 07/01/2015  . Allergic rhinitis 03/20/2015  . Anxiety disorder 11/22/2014    Past Surgical History:  Procedure Laterality Date  . NO PAST SURGERIES       No current facility-administered medications for this encounter.   Current Outpatient Medications:  .  fluticasone (FLONASE) 50 MCG/ACT nasal spray, Place 2 sprays into both nostrils daily., Disp: 16 g, Rfl: 6 .  LORazepam (ATIVAN) 1 MG tablet, Take 1/2 to 1 tablet daily PRN, Disp: 30 tablet, Rfl: 0 .  sertraline (ZOLOFT) 100 MG tablet, Take 1 tablet (100 mg total) by mouth daily., Disp: 90 tablet, Rfl: 4 .  amoxicillin (AMOXIL) 875 MG tablet, Take 1 tablet (875 mg total) 2 (two) times daily by mouth., Disp: 20 tablet, Rfl: 0 .  buPROPion (WELLBUTRIN SR) 150 MG 12 hr tablet, 1 tab daily for 3 days,  then increase to 1 tab BID. Pick a day in the 2nd week to quit, Disp: 60 tablet, Rfl: 3 .  meloxicam (MOBIC) 15 MG tablet, Take 1 tablet (15 mg total) by mouth daily., Disp: 30 tablet, Rfl: 0  Allergies Patient has no known allergies.  Family History  Problem Relation Age of Onset  . Hypertension Father   . Hypertension Mother   . Anxiety disorder Mother   . Cancer Paternal Grandfather     Social History Social History   Tobacco Use  . Smoking status: Current Every Day Smoker    Packs/day: 1.00    Years: 15.00    Pack years: 15.00    Types: Cigarettes  . Smokeless tobacco: Current User    Types: Snuff  Substance Use Topics  . Alcohol use: Yes    Alcohol/week: 0.0 oz    Comment: one or less per day, rare  . Drug use: No    Review of Systems Constitutional: No fever/chills ENT: Positive sore throat. Cardiovascular: Denies chest pain. Respiratory: Denies shortness of breath. Gastrointestinal: No abdominal pain.   Musculoskeletal: Negative for back pain. Skin: Negative for rash.   ____________________________________________   PHYSICAL EXAM:  VITAL SIGNS: ED Triage Vitals [04/14/17 0829]  Enc Vitals Group     BP (!) 138/98     Pulse Rate 79     Resp 16     Temp 98.5 F (36.9 C)     Temp Source Oral  SpO2 97 %     Weight      Height      Head Circumference      Peak Flow      Pain Score      Pain Loc      Pain Edu?      Excl. in GC?     Constitutional: Alert and oriented. Well appearing and in no acute distress. Eyes: Conjunctivae are normal.  Head: Atraumatic. No sinus tenderness to palpation. No swelling. No erythema.  Ears: no erythema, normal TMs bilaterally.   Nose:No nasal congestion   Mouth/Throat: Mucous membranes are moist. Moderate pharyngeal erythema.  Mild bilateral tonsillar swelling and uvular swelling.  No uvular shift.  No exudate. Neck: No stridor.  No cervical spine tenderness to  palpation. Hematological/Lymphatic/Immunilogical: Mild anterior bilateral cervical lymphadenopathy. Cardiovascular: Normal rate, regular rhythm. Grossly normal heart sounds.  Good peripheral circulation. Respiratory: Normal respiratory effort.  No retractions. No wheezes, rales or rhonchi. Good air movement.  Musculoskeletal: Ambulatory with steady gait. No cervical, thoracic or lumbar tenderness to palpation. Neurologic:  Normal speech and language. No gait instability. Skin:  Skin appears warm, dry Psychiatric: Mood and affect are normal. Speech and behavior are normal.  ___________________________________________   LABS (all labs ordered are listed, but only abnormal results are displayed)  Labs Reviewed  RAPID STREP SCREEN (NOT AT [facility])  CULTURE, GROUP A STREP [facility])     PROCEDURES Procedures    INITIAL IMPRESSION / ASSESSMENT AND PLAN / ED COURSE  Pertinent labs & imaging results that were available during my care of the patient were reviewed by me and considered in my medical decision making (see chart for details).  Well-appearing patient.  No acute distress.  Quick strep negative, will culture.  However suspect streptococcal pharyngitis.  Will treat patient with oral amoxicillin.  Encourage rest, fluids, supportive care.  Work note given for today and tomorrow.Discussed indication, risks and benefits of medications with patient.  Discussed follow up with Primary care physician this week. Discussed follow up and return parameters including no resolution or any worsening concerns. Patient verbalized understanding and agreed to plan.   ____________________________________________   FINAL CLINICAL IMPRESSION(S) / ED DIAGNOSES  Final diagnoses:  Pharyngitis, unspecified etiology     This SmartLink is deprecated. Use AVSMEDLIST instead to display the medication list for a patient.  Note: This dictation was prepared with Dragon dictation along with smaller phrase  technology. Any transcriptional errors that result from this process are unintentional.         Renford DillsMiller, Garnett Rekowski, NP 04/14/17 0900

## 2017-04-16 LAB — CULTURE, GROUP A STREP (THRC)

## 2017-05-30 ENCOUNTER — Encounter: Payer: Self-pay | Admitting: Family Medicine

## 2017-07-29 ENCOUNTER — Encounter: Payer: Self-pay | Admitting: Family Medicine

## 2017-09-14 ENCOUNTER — Encounter: Payer: Self-pay | Admitting: Family Medicine

## 2017-09-14 ENCOUNTER — Ambulatory Visit (INDEPENDENT_AMBULATORY_CARE_PROVIDER_SITE_OTHER): Payer: PRIVATE HEALTH INSURANCE | Admitting: Family Medicine

## 2017-09-14 VITALS — BP 138/84 | HR 85 | Temp 98.7°F | Wt 211.0 lb

## 2017-09-14 DIAGNOSIS — L239 Allergic contact dermatitis, unspecified cause: Secondary | ICD-10-CM | POA: Diagnosis not present

## 2017-09-14 MED ORDER — TRIAMCINOLONE ACETONIDE 0.5 % EX OINT: 1.0000 "application " | TOPICAL_OINTMENT | Freq: Two times a day (BID) | CUTANEOUS | 0 refills | Status: DC

## 2017-09-14 MED ORDER — TRIAMCINOLONE ACETONIDE 40 MG/ML IJ SUSP
40.0000 mg | Freq: Once | INTRAMUSCULAR | Status: AC
  Administered 2017-09-14: 40 mg via INTRAMUSCULAR

## 2017-09-14 NOTE — Progress Notes (Signed)
BP 138/84 (BP Location: Left Arm, Patient Position: Sitting, Cuff Size: Normal)   Pulse 85   Temp 98.7 F (37.1 C) (Oral)   Wt 211 lb (95.7 kg)   SpO2 99%   BMI 26.37 kg/m    Subjective:    Patient ID: Shawn Andrade, male    DOB: 04/13/1976, 42 y.o.   MRN: [id number]  HPI: Shawn Andrade is a 42 y.o. male  Chief Complaint  Patient presents with  . Rash    itchy light red rash on both arms.   RASH- clearing a brush pile over the weekend and has gotten into some poison oak Duration:  2 days  Location: arms  Itching: yes Burning: yes Redness: yes Oozing: no Scaling: yes Blisters: no Painful: no Fevers: no Change in detergents/soaps/personal care products: no Recent illness: no Recent travel:no History of same: yes Context: worse Alleviating factors: nothing Treatments attempted:nothing Shortness of breath: no  Throat/tongue swelling: no Myalgias/arthralgias: no  Relevant past medical, surgical, family and social history reviewed and updated as indicated. Interim medical history since our last visit reviewed. Allergies and medications reviewed and updated.  Review of Systems  Constitutional: Negative.   Respiratory: Negative.   Cardiovascular: Negative.   Musculoskeletal: Negative.   Skin: Positive for rash. Negative for color change, pallor and wound.  Psychiatric/Behavioral: Negative.     Per HPI unless specifically indicated above     Objective:    BP 138/84 (BP Location: Left Arm, Patient Position: Sitting, Cuff Size: Normal)   Pulse 85   Temp 98.7 F (37.1 C) (Oral)   Wt 211 lb (95.7 kg)   SpO2 99%   BMI 26.37 kg/m   Wt Readings from Last 3 Encounters:  09/14/17 211 lb (95.7 kg)  01/29/17 210 lb (95.3 kg)  08/13/16 219 lb (99.3 kg)    Physical Exam  Constitutional: He is oriented to person, place, and time. He appears well-developed and well-nourished. No distress.  HENT:  Head: Normocephalic and atraumatic.  Right Ear: Hearing normal.   Left Ear: Hearing normal.  Nose: Nose normal.  Eyes: Conjunctivae and lids are normal. Right eye exhibits no discharge. Left eye exhibits no discharge. No scleral icterus.  Cardiovascular: Normal rate, regular rhythm, normal heart sounds and intact distal pulses. Exam reveals no gallop and no friction rub.  No murmur heard. Pulmonary/Chest: Effort normal and breath sounds normal. No stridor. No respiratory distress. He has no wheezes. He has no rales. He exhibits no tenderness.  Musculoskeletal: Normal range of motion.  Neurological: He is alert and oriented to person, place, and time.  Skin: Skin is warm, dry and intact. Capillary refill takes less than 2 seconds. Rash (4-5 patches of erythematous maculopapular rash on arms bilaterally) noted. He is not diaphoretic. No erythema. No pallor.  Psychiatric: He has a normal mood and affect. His speech is normal and behavior is normal. Judgment and thought content normal. Cognition and memory are normal.    Results for orders placed or performed during the hospital encounter of 04/14/17  Rapid strep screen  Result Value Ref Range   Streptococcus, Group A Screen (Direct) NEGATIVE NEGATIVE  Culture, group A strep  Result Value Ref Range   Specimen Description THROAT    Special Requests NONE Reflexed from [id number]    Culture FEW STREPTOCOCCUS,BETA HEMOLYTIC NOT GROUP A    Report Status 04/16/2017 FINAL       Assessment & Plan:   Problem List Items Addressed This Visit  None    Visit Diagnoses    Allergic contact dermatitis, unspecified trigger    -  Primary   Will treat with triamcinalone shot in office and steroid cream at home. If not better by Friday, let us know and we'll call in oral steroids.   Relevant Medications   triamcinolone acetonide (KENALOG-40) injection 40 mg (Completed)       Follow up plan: Return if symptoms worsen or fail to improve.

## 2017-11-22 ENCOUNTER — Other Ambulatory Visit: Payer: Self-pay | Admitting: Family Medicine

## 2017-11-24 NOTE — Telephone Encounter (Signed)
Interface request for refill of Zoloft 1/day disp# 90  LOV:  with Olevia PerchesMegan Johnson , DO    /// Last refill  08/27/17   [facility]  [address]

## 2018-02-10 ENCOUNTER — Ambulatory Visit (INDEPENDENT_AMBULATORY_CARE_PROVIDER_SITE_OTHER): Payer: PRIVATE HEALTH INSURANCE | Admitting: Family Medicine

## 2018-02-10 ENCOUNTER — Other Ambulatory Visit: Payer: Self-pay

## 2018-02-10 ENCOUNTER — Encounter: Payer: Self-pay | Admitting: Family Medicine

## 2018-02-10 VITALS — BP 134/88 | HR 73 | Temp 98.1°F | Ht 74.0 in | Wt 207.0 lb

## 2018-02-10 DIAGNOSIS — F411 Generalized anxiety disorder: Secondary | ICD-10-CM

## 2018-02-10 DIAGNOSIS — Z0000 Encounter for general adult medical examination without abnormal findings: Secondary | ICD-10-CM | POA: Diagnosis not present

## 2018-02-10 LAB — UA/M W/RFLX CULTURE, ROUTINE
Bilirubin, UA: NEGATIVE
Glucose, UA: NEGATIVE
KETONES UA: NEGATIVE
LEUKOCYTES UA: NEGATIVE
NITRITE UA: NEGATIVE
PH UA: 5.5 (ref 5.0–7.5)
Protein, UA: NEGATIVE
RBC, UA: NEGATIVE
Specific Gravity, UA: 1.02 (ref 1.005–1.030)
Urobilinogen, Ur: 0.2 mg/dL (ref 0.2–1.0)

## 2018-02-10 MED ORDER — FLUTICASONE PROPIONATE 50 MCG/ACT NA SUSP: 2.0000 | Freq: Every day | NASAL | 6 refills | Status: DC

## 2018-02-10 MED ORDER — RANITIDINE HCL 150 MG PO TABS: 150.0000 mg | ORAL_TABLET | Freq: Two times a day (BID) | ORAL | 1 refills | Status: DC

## 2018-02-10 MED ORDER — SERTRALINE HCL 100 MG PO TABS: ORAL_TABLET | ORAL | 1 refills | Status: DC

## 2018-02-10 NOTE — Progress Notes (Signed)
BP 134/88   Pulse 73   Temp 98.1 F (36.7 C) (Oral)   Ht 6\' 2"  (1.88 m)   Wt 207 lb (93.9 kg)   SpO2 97%   BMI 26.58 kg/m    Subjective:    Patient ID: Shawn Andrade, male    DOB: 1975-12-31, 42 y.o.   MRN: [id number]  HPI: Shawn Andrade is a 42 y.o. male presenting on 02/10/2018 for comprehensive medical examination. Current medical complaints include:  ANXIETY/STRESS- ran out of his sertraline about 3-4 months ago. Unclear why he didn't get it from pharmacy. He states that he is feeling well.  Duration:stable Anxious mood: yes  Excessive worrying: yes Irritability: yes  Sweating: no Nausea: no Palpitations:no Hyperventilation: no Panic attacks: no Agoraphobia: no  Obscessions/compulsions: no Depressed mood: no Depression screen [facility] 2/9 02/10/2018 09/14/2017 07/01/2015 11/22/2014  Decreased Interest 0 0 0 0  Down, Depressed, Hopeless 0 0 0 0  PHQ - 2 Score 0 0 0 0  Altered sleeping 0 0 - 0  Tired, decreased energy 1 1 - 1  Change in appetite 0 0 - 0  Feeling bad or failure about yourself  0 0 - 0  Trouble concentrating 0 0 - 0  Moving slowly or fidgety/restless 0 0 - 0  Suicidal thoughts 0 0 - 0  PHQ-9 Score 1 1 - 1  Difficult doing work/chores Not difficult at all Not difficult at all - Not difficult at all   GAD 7 : Generalized Anxiety Score 02/10/2018 09/14/2017  Nervous, Anxious, on Edge 1 0  Control/stop worrying 1 0  Worry too much - different things 1 0  Trouble relaxing 0 0  Restless 0 0  Easily annoyed or irritable 0 0  Afraid - awful might happen 0 0  Total GAD 7 Score 3 0  Anxiety Difficulty Not difficult at all Not difficult at all   Anhedonia: no Weight changes: no Insomnia: no   Hypersomnia: no Fatigue/loss of energy: no Feelings of worthlessness: no Feelings of guilt: no Impaired concentration/indecisiveness: no Suicidal ideations: no  Crying spells: no Recent Stressors/Life Changes: no   Relationship problems: no   Family stress: no    Financial stress: no    Job stress: no    Recent death/loss: no  He currently lives with: wife and kids Interim Problems from his last visit: no  Depression Screen done today and results listed below:  Depression screen [facility] 2/9 02/10/2018 09/14/2017 07/01/2015 11/22/2014  Decreased Interest 0 0 0 0  Down, Depressed, Hopeless 0 0 0 0  PHQ - 2 Score 0 0 0 0  Altered sleeping 0 0 - 0  Tired, decreased energy 1 1 - 1  Change in appetite 0 0 - 0  Feeling bad or failure about yourself  0 0 - 0  Trouble concentrating 0 0 - 0  Moving slowly or fidgety/restless 0 0 - 0  Suicidal thoughts 0 0 - 0  PHQ-9 Score 1 1 - 1  Difficult doing work/chores Not difficult at all Not difficult at all - Not difficult at all    Past Medical History:  Past Medical History:  Diagnosis Date  . Allergy   . Anxiety   . Elevated blood pressure   . IFG (impaired fasting glucose)     Surgical History:  Past Surgical History:  Procedure Laterality Date  . NO PAST SURGERIES      Medications:  No current outpatient medications on file prior to visit.  No current facility-administered medications on file prior to visit.     Allergies:  No Known Allergies  Social History:  Social History   Socioeconomic History  . Marital status: Married    Spouse name: Not on file  . Number of children: 2  . Years of education: Not on file  . Highest education level: Not on file  Occupational History  . 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 Financial resource strain: Not on file  . Food insecurity:    Worry: Not on file    Inability: Not on file  . Transportation needs:    Medical: Not on file    Non-medical: Not on file  Tobacco Use  . Smoking status: Current Every Day Smoker    Packs/day: 1.00    Years: 15.00    Pack years: 15.00    Types: Cigarettes  . Smokeless tobacco: Current User    Types: Snuff  Substance and Sexual Activity  . Alcohol use: Yes    Alcohol/week:  0.0 standard drinks    Comment: one or less per day, rare  . Drug use: No  . Sexual activity: Yes  Lifestyle  . Physical activity:    Days per week: Not on file    Minutes per session: Not on file  . Stress: Not on file  Relationships  . Social connections:    Talks on phone: Not on file    Gets together: Not on file    Attends religious service: Not on file    Active member of club or organization: Not on file    Attends meetings of clubs or organizations: Not on file    Relationship status: Not on file  . Intimate partner violence:    Fear of current or ex partner: Not on file    Emotionally abused: Not on file    Physically abused: Not on file    Forced sexual activity: Not on file  Other Topics Concern  . Not on file  Social History Narrative  . Not on file   Social History   Tobacco Use  Smoking Status Current Every Day Smoker  . Packs/day: 1.00  . Years: 15.00  . Pack years: 15.00  . Types: Cigarettes  Smokeless Tobacco Current User  . Types: Snuff   Social History   Substance and Sexual Activity  Alcohol Use Yes  . Alcohol/week: 0.0 standard drinks   Comment: one or less per day, rare    Family History:  Family History  Problem Relation Age of Onset  . Hypertension Father   . Hypertension Mother   . Anxiety disorder Mother   . Cancer Paternal Grandfather     Past medical history, surgical history, medications, allergies, family history and social history reviewed with patient today and changes made to appropriate areas of the chart.   Review of Systems  Constitutional: Negative.   HENT: Negative.   Eyes: Negative.   Respiratory: Negative.   Cardiovascular: Negative.   Gastrointestinal: Positive for heartburn (better with zantac). Negative for abdominal pain, blood in stool, constipation, diarrhea, melena, nausea and vomiting.  Genitourinary: Negative.   Musculoskeletal: Negative.   Skin: Negative.   Neurological: Negative.     Endo/Heme/Allergies: Negative.   Psychiatric/Behavioral: Negative for depression, hallucinations, memory loss, substance abuse and suicidal ideas. The patient is nervous/anxious. The patient does not have insomnia.     All other ROS negative except what is listed above and in the  HPI.      Objective:    BP 134/88   Pulse 73   Temp 98.1 F (36.7 C) (Oral)   Ht 6\' 2"  (1.88 m)   Wt 207 lb (93.9 kg)   SpO2 97%   BMI 26.58 kg/m   Wt Readings from Last 3 Encounters:  02/10/18 207 lb (93.9 kg)  09/14/17 211 lb (95.7 kg)  01/29/17 210 lb (95.3 kg)    Physical Exam  Constitutional: He is oriented to person, place, and time. He appears well-developed and well-nourished. No distress.  HENT:  Head: Normocephalic and atraumatic.  Right Ear: Hearing, tympanic membrane, external ear and ear canal normal.  Left Ear: Hearing, tympanic membrane, external ear and ear canal normal.  Nose: Nose normal.  Mouth/Throat: Uvula is midline, oropharynx is clear and moist and mucous membranes are normal. No oropharyngeal exudate.  Eyes: Pupils are equal, round, and reactive to light. Conjunctivae, EOM and lids are normal. Right eye exhibits no discharge. Left eye exhibits no discharge. No scleral icterus.  Neck: Normal range of motion. Neck supple. No JVD present. No tracheal deviation present. No thyromegaly present.  Cardiovascular: Normal rate, regular rhythm, normal heart sounds and intact distal pulses. Exam reveals no gallop and no friction rub.  No murmur heard. Pulmonary/Chest: Effort normal and breath sounds normal. No stridor. No respiratory distress. He has no wheezes. He has no rales. He exhibits no tenderness.  Abdominal: Soft. Bowel sounds are normal. He exhibits no distension and no mass. There is no tenderness. There is no rebound and no guarding. No hernia. Hernia confirmed negative in the right inguinal area and confirmed negative in the left inguinal area.  Genitourinary: Testes normal  and penis normal. Cremasteric reflex is present. Right testis shows no mass, no swelling and no tenderness. Right testis is descended. Cremasteric reflex is not absent on the right side. Left testis shows no mass, no swelling and no tenderness. Left testis is descended. Cremasteric reflex is not absent on the left side. Circumcised. No phimosis, paraphimosis, hypospadias, penile erythema or penile tenderness. No discharge found.  Musculoskeletal: Normal range of motion. He exhibits no edema, tenderness or deformity.  Lymphadenopathy:    He has no cervical adenopathy.  Neurological: He is alert and oriented to person, place, and time. He displays normal reflexes. No cranial nerve deficit or sensory deficit. He exhibits normal muscle tone. Coordination normal.  Skin: Skin is warm, dry and intact. Capillary refill takes less than 2 seconds. No rash noted. He is not diaphoretic. No erythema. No pallor.  Psychiatric: He has a normal mood and affect. His speech is normal and behavior is normal. Judgment and thought content normal. Cognition and memory are normal.  Nursing note and vitals reviewed.   Results for orders placed or performed during the hospital encounter of 04/14/17  Rapid strep screen  Result Value Ref Range   Streptococcus, Group A Screen (Direct) NEGATIVE NEGATIVE  Culture, group A strep  Result Value Ref Range   Specimen Description THROAT    Special Requests NONE Reflexed from [id number]    Culture FEW STREPTOCOCCUS,BETA HEMOLYTIC NOT GROUP A    Report Status 04/16/2017 FINAL       Assessment & Plan:   Problem List Items Addressed This Visit      Other   Anxiety disorder    Did well on sertraline, but ran out. Will restart it. Call with any concerns. Continue to monitor. Refills given.       Relevant Medications  sertraline (ZOLOFT) 100 MG tablet    Other Visit Diagnoses    Routine general medical examination at a health care facility    -  Primary   Vaccines up to date.  Screening labs checked today. Continue diet and exercise. Call with any concerns.    Relevant Orders   CBC with Differential/Platelet   Comprehensive metabolic panel   Lipid Panel w/o Chol/HDL Ratio   TSH   UA/M w/rflx Culture, Routine       LABORATORY TESTING:  Health maintenance labs ordered today as discussed above.    IMMUNIZATIONS:   - Tdap: Tetanus vaccination status reviewed: last tetanus booster within 10 years. - Influenza: Refused  PATIENT COUNSELING:    Sexuality: Discussed sexually transmitted diseases, partner selection, use of condoms, avoidance of unintended pregnancy  and contraceptive alternatives.   Advised to avoid cigarette smoking.  I discussed with the patient that most people either abstain from alcohol or drink within safe limits (<=14/week and <=4 drinks/occasion for males, <=7/weeks and <= 3 drinks/occasion for females) and that the risk for alcohol disorders and other health effects rises proportionally with the number of drinks per week and how often a drinker exceeds daily limits.  Discussed cessation/primary prevention of drug use and availability of treatment for abuse.   Diet: Encouraged to adjust caloric intake to maintain  or achieve ideal body weight, to reduce intake of dietary saturated fat and total fat, to limit sodium intake by avoiding high sodium foods and not adding table salt, and to maintain adequate dietary potassium and calcium preferably from fresh fruits, vegetables, and low-fat dairy products.    stressed the importance of regular exercise  Injury prevention: Discussed safety belts, safety helmets, smoke detector, smoking near bedding or upholstery.   Dental health: Discussed importance of regular tooth brushing, flossing, and dental visits.   Follow up plan: NEXT PREVENTATIVE PHYSICAL DUE IN 1 YEAR. Return in about 6 months (around 08/11/2018) for Follow up mood.

## 2018-02-10 NOTE — Patient Instructions (Signed)

## 2018-02-10 NOTE — Assessment & Plan Note (Signed)
Did well on sertraline, but ran out. Will restart it. Call with any concerns. Continue to monitor. Refills given.

## 2018-02-11 LAB — LIPID PANEL W/O CHOL/HDL RATIO
CHOLESTEROL TOTAL: 192 mg/dL (ref 100–199)
HDL: 46 mg/dL (ref >39)
LDL CALC: 122 mg/dL — AB (ref 0–99)
Triglycerides: 121 mg/dL (ref 0–149)
VLDL Cholesterol Cal: 24 mg/dL (ref 5–40)

## 2018-02-11 LAB — CBC WITH DIFFERENTIAL/PLATELET
Basophils Absolute: 0 10*3/uL (ref 0.0–0.2)
Basos: 0 %
EOS (ABSOLUTE): 0.3 10*3/uL (ref 0.0–0.4)
Eos: 3 %
HEMATOCRIT: 45.5 % (ref 37.5–51.0)
Hemoglobin: 15.1 g/dL (ref 13.0–17.7)
Immature Grans (Abs): 0 10*3/uL (ref 0.0–0.1)
Immature Granulocytes: 0 %
LYMPHS ABS: 1.8 10*3/uL (ref 0.7–3.1)
Lymphs: 23 %
MCH: 29.5 pg (ref 26.6–33.0)
MCHC: 33.2 g/dL (ref 31.5–35.7)
MCV: 89 fL (ref 79–97)
MONOS ABS: 0.5 10*3/uL (ref 0.1–0.9)
Monocytes: 6 %
NEUTROS PCT: 68 %
Neutrophils Absolute: 5.4 10*3/uL (ref 1.4–7.0)
PLATELETS: 226 10*3/uL (ref 150–450)
RBC: 5.12 x10E6/uL (ref 4.14–5.80)
RDW: 13.8 % (ref 12.3–15.4)
WBC: 8 10*3/uL (ref 3.4–10.8)

## 2018-02-11 LAB — TSH: TSH: 0.6 u[IU]/mL (ref 0.450–4.500)

## 2018-02-11 LAB — COMPREHENSIVE METABOLIC PANEL
ALT: 16 IU/L (ref 0–44)
AST: 16 IU/L (ref 0–40)
Albumin/Globulin Ratio: 2 (ref 1.2–2.2)
Albumin: 4.5 g/dL (ref 3.5–5.5)
Alkaline Phosphatase: 80 IU/L (ref 39–117)
BUN/Creatinine Ratio: 20 (ref 9–20)
BUN: 16 mg/dL (ref 6–24)
Bilirubin Total: 0.4 mg/dL (ref 0.0–1.2)
CO2: 20 mmol/L (ref 20–29)
CREATININE: 0.82 mg/dL (ref 0.76–1.27)
Calcium: 9.4 mg/dL (ref 8.7–10.2)
Chloride: 103 mmol/L (ref 96–106)
GFR calc Af Amer: 125 mL/min/{1.73_m2} (ref >59)
GFR calc non Af Amer: 108 mL/min/{1.73_m2} (ref >59)
GLOBULIN, TOTAL: 2.3 g/dL (ref 1.5–4.5)
Glucose: 93 mg/dL (ref 65–99)
POTASSIUM: 4.2 mmol/L (ref 3.5–5.2)
SODIUM: 139 mmol/L (ref 134–144)
Total Protein: 6.8 g/dL (ref 6.0–8.5)

## 2018-02-21 ENCOUNTER — Encounter: Payer: Self-pay | Admitting: Family Medicine

## 2018-04-15 ENCOUNTER — Ambulatory Visit: Payer: PRIVATE HEALTH INSURANCE | Admitting: Family Medicine

## 2018-07-15 ENCOUNTER — Other Ambulatory Visit: Payer: Self-pay | Admitting: Family Medicine

## 2018-07-15 ENCOUNTER — Telehealth: Payer: Self-pay | Admitting: Family Medicine

## 2018-07-15 NOTE — Telephone Encounter (Signed)
Copied from CRM [id number]. Topic: Quick Communication - Rx Refill/Question >> Jul 15, 2018 11:24 AM Mickel Baas B, NT wrote: Medication: LORazepam (ATIVAN) 1 MG tablet    (not on current medication list)  Has the patient contacted their pharmacy? Yes.   (Agent: If no, request that the patient contact the pharmacy for the refill.) (Agent: If yes, when and what did the pharmacy advise?)  Preferred Pharmacy (with phone number or street name): [id number] [place], [place] - [address]  Agent: Please be advised that RX refills may take up to 3 business days. We ask that you follow-up with your pharmacy.

## 2018-07-15 NOTE — Telephone Encounter (Signed)
Patient calling and states that he is having to go out of town unexpectedly and is needing this medication. States that he has to leave today around 2-3pm. Would like to know if this could be sent to the pharmacy. Please advise.   [id number] - [place], [place] - [address]

## 2018-07-15 NOTE — Telephone Encounter (Signed)
Patient has appointment 08/11/18. Requested Prescriptions  Pending Prescriptions Disp Refills  . sertraline (ZOLOFT) 100 MG tablet [Pharmacy Med Name: SERTRALINE HCL 100 MG TABLET] 90 tablet 0    Sig: TAKE 1/2 TAB BY MOUTH A WEEK FOR 2 WEEKS, THEN INCREASE TO 1 TAB DAILY     Psychiatry:  Antidepressants - SSRI Passed - 07/15/2018 11:24 AM      Passed - Valid encounter within last 6 months    Recent Outpatient Visits          5 months ago Routine general medical examination at a health care facility   [facility], Megan P, DO   10 months ago Allergic contact dermatitis, unspecified trigger   [facility] [place], Megan P, DO   1 year ago Routine general medical examination at a health care facility   [facility], [place] P, DO   1 year ago Injury of finger of left hand, initial encounter   [facility] Particia Nearing, [place]   2 years ago Chronic fatigue   [facility] [place], [place], [place]      Future Appointments            In 3 weeks Laural Benes, Oralia Rud, DO [employer], [facility]

## 2018-07-17 NOTE — Telephone Encounter (Signed)
Has not been written that Rx since 01/2017- if he's feeling worse and feels like he needs it again (unless something very sudden has happened) He will need an appointment to discuss this.

## 2018-07-18 NOTE — Telephone Encounter (Signed)
Pt called, he says that he does not need this medication.

## 2018-07-18 NOTE — Telephone Encounter (Signed)
Noted  

## 2018-07-18 NOTE — Telephone Encounter (Signed)
Attempted to reach patient. Unable to leave VM. Will attempt again later.

## 2018-07-18 NOTE — Telephone Encounter (Signed)
Routing to provider  

## 2018-07-30 ENCOUNTER — Other Ambulatory Visit: Payer: Self-pay | Admitting: Family Medicine

## 2018-08-11 ENCOUNTER — Ambulatory Visit: Payer: PRIVATE HEALTH INSURANCE | Admitting: Family Medicine

## 2018-10-07 ENCOUNTER — Other Ambulatory Visit: Payer: Self-pay | Admitting: Family Medicine

## 2018-10-07 NOTE — Telephone Encounter (Signed)
Scheduled appt.MyChart call

## 2018-10-07 NOTE — Telephone Encounter (Signed)
Requested medication (s) are due for refill today: yes  Requested medication (s) are on the active medication list: yes  Last refill: 07/15/2018  Future visit scheduled: yes  Notes to clinic:  Sig needs update.    Requested Prescriptions  Pending Prescriptions Disp Refills   sertraline (ZOLOFT) 100 MG tablet [Pharmacy Med Name: SERTRALINE HCL 100 MG TABLET] 90 tablet 0    Sig: TAKE 1/2 TAB BY MOUTH A WEEK FOR 2 WEEKS, THEN INCREASE TO 1 TAB DAILY     Psychiatry:  Antidepressants - SSRI Failed - 10/07/2018 12:59 AM      Failed - Valid encounter within last 6 months    Recent Outpatient Visits          7 months ago Routine general medical examination at a health care facility   [facility], [place] P, DO   1 year ago Allergic contact dermatitis, unspecified trigger   [facility] Dorcas Carrow, DO   1 year ago Routine general medical examination at a health care facility   [facility], [place] P, DO   2 years ago Injury of finger of left hand, initial encounter   [facility] Particia Nearing, [place]   2 years ago Chronic fatigue   [facility] [place], [place], DO      Future Appointments            In 4 days Laural Benes, Oralia Rud, DO [facility], [facility]

## 2018-10-07 NOTE — Telephone Encounter (Signed)
Needs follow-up

## 2018-10-07 NOTE — Telephone Encounter (Signed)
Please advise 

## 2018-10-07 NOTE — Telephone Encounter (Signed)
Scheduled appt.

## 2018-10-11 ENCOUNTER — Other Ambulatory Visit: Payer: Self-pay

## 2018-10-11 ENCOUNTER — Telehealth (INDEPENDENT_AMBULATORY_CARE_PROVIDER_SITE_OTHER): Payer: PRIVATE HEALTH INSURANCE | Admitting: Family Medicine

## 2018-10-11 ENCOUNTER — Encounter: Payer: Self-pay | Admitting: Family Medicine

## 2018-10-11 DIAGNOSIS — R0683 Snoring: Secondary | ICD-10-CM

## 2018-10-11 DIAGNOSIS — F411 Generalized anxiety disorder: Secondary | ICD-10-CM

## 2018-10-11 MED ORDER — LORAZEPAM 0.5 MG PO TABS: 0.5000 mg | ORAL_TABLET | Freq: Two times a day (BID) | ORAL | 0 refills | Status: DC | PRN

## 2018-10-11 MED ORDER — SERTRALINE HCL 100 MG PO TABS: 100.0000 mg | ORAL_TABLET | Freq: Every day | ORAL | 1 refills | Status: DC

## 2018-10-11 NOTE — Assessment & Plan Note (Signed)
Under good control on current regimen. Continue current regimen. Continue to monitor. Call with any concerns. Refills given. Refill of lorazepam given today- should last about 6 months.

## 2018-10-11 NOTE — Progress Notes (Signed)
There were no vitals taken for this visit.   Subjective:    Patient ID: Shawn Andrade, male    DOB: 04-Sep-1975, 43 y.o.   MRN: [id number]  HPI: Shawn Andrade is a 43 y.o. male  Chief Complaint  Patient presents with  . Anxiety   ANXIETY/STRESS Duration:stable Anxious mood: yes  Excessive worrying: no Irritability: no  Sweating: no Nausea: no Palpitations:no Hyperventilation: no Panic attacks: no Agoraphobia: no  Obscessions/compulsions: no Depressed mood: no Depression screen [facility] 2/9 10/11/2018 02/10/2018 09/14/2017 07/01/2015 11/22/2014  Decreased Interest 0 0 0 0 0  Down, Depressed, Hopeless 0 0 0 0 0  PHQ - 2 Score 0 0 0 0 0  Altered sleeping 1 0 0 - 0  Tired, decreased energy - 1  Change in appetite 0 0 0 - 0  Feeling bad or failure about yourself  0 0 0 - 0  Trouble concentrating 0 0 0 - 0  Moving slowly or fidgety/restless 0 0 0 - 0  Suicidal thoughts 0 0 0 - 0  PHQ-9 Score - 1  Difficult doing work/chores Not difficult at all Not difficult at all Not difficult at all - Not difficult at all   GAD 7 : Generalized Anxiety Score 10/11/2018 02/10/2018 09/14/2017  Nervous, Anxious, on Edge 0 1 0  Control/stop worrying 0 1 0  Worry too much - different things 0 1 0  Trouble relaxing 0 0 0  Restless 0 0 0  Easily annoyed or irritable 0 0 0  Afraid - awful might happen 0 0 0  Total GAD 7 Score 0 3 0  Anxiety Difficulty Not difficult at all Not difficult at all Not difficult at all   Anhedonia: no Weight changes: no Insomnia: yes hard to stay asleep  Hypersomnia: yes Fatigue/loss of energy: yes Feelings of worthlessness: no Feelings of guilt: no Impaired concentration/indecisiveness: no Suicidal ideations: no  Crying spells: no Recent Stressors/Life Changes: yes   Relationship problems: no   Family stress: yes     Financial stress: no    Job stress: yes    Recent death/loss: no  ???SLEEP APNEA Sleep apnea status: worse Duration: months to a year  Satisfied with current treatment?:  no CPAP use:  no Wakes feeling refreshed:  no Daytime hypersomnolence:  yes Fatigue:  yes Insomnia:  no Good sleep hygiene:  yes Difficulty falling asleep:  no Difficulty staying asleep:  yes Snoring bothers bed partner:  yes Observed apnea by bed partner: yes Obesity:  no Hypertension: no  Pulmonary hypertension:  no Coronary artery disease:  no  Relevant past medical, surgical, family and social history reviewed and updated as indicated. Interim medical history since our last visit reviewed. Allergies and medications reviewed and updated.  Review of Systems  Constitutional: Positive for fatigue. Negative for activity change, appetite change, chills, diaphoresis, fever and unexpected weight change.  Respiratory: Negative.   Cardiovascular: Negative.   Musculoskeletal: Negative.   Skin: Negative.   Psychiatric/Behavioral: Positive for sleep disturbance. Negative for agitation, behavioral problems, confusion, decreased concentration, dysphoric mood, hallucinations, self-injury and suicidal ideas. The patient is nervous/anxious. The patient is not hyperactive.     Per HPI unless specifically indicated above     Objective:    There were no vitals taken for this visit.  Wt Readings from Last 3 Encounters:  02/10/18 207 lb (93.9 kg)  09/14/17 211 lb (95.7 kg)  01/29/17 210 lb (95.3 kg)  Physical Exam Vitals signs and nursing note reviewed.  Constitutional:      General: He is not in acute distress.    Appearance: Normal appearance. He is not ill-appearing, toxic-appearing or diaphoretic.  HENT:     Head: Normocephalic and atraumatic.     Right Ear: External ear normal.     Left Ear: External ear normal.     Nose: Nose normal.     Mouth/Throat:     Mouth: Mucous membranes are moist.     Pharynx: Oropharynx is clear.  Eyes:     General: No scleral icterus.       Right eye: No discharge.        Left eye: No discharge.      Conjunctiva/sclera: Conjunctivae normal.     Pupils: Pupils are equal, round, and reactive to light.  Neck:     Musculoskeletal: Normal range of motion.  Pulmonary:     Effort: Pulmonary effort is normal. No respiratory distress.     Comments: Speaking in full sentences Musculoskeletal: Normal range of motion.  Skin:    Coloration: Skin is not jaundiced or pale.     Findings: No bruising, erythema, lesion or rash.  Neurological:     Mental Status: He is alert and oriented to person, place, and time. Mental status is at baseline.  Psychiatric:        Mood and Affect: Mood normal.        Behavior: Behavior normal.        Thought Content: Thought content normal.        Judgment: Judgment normal.     Results for orders placed or performed in visit on 02/10/18  CBC with Differential/Platelet  Result Value Ref Range   WBC 8.0 3.4 - 10.8 x10E3/uL   RBC 5.12 4.14 - 5.80 x10E6/uL   Hemoglobin 15.1 13.0 - 17.7 g/dL   Hematocrit 16.145.5 09.637.5 - 51.0 %   MCV 89 79 - 97 fL   MCH 29.5 26.6 - 33.0 pg   MCHC 33.2 31.5 - 35.7 g/dL   RDW 04.513.8 40.912.3 - 81.115.4 %   Platelets 226 150 - 450 x10E3/uL   Neutrophils 68 Not Estab. %   Lymphs 23 Not Estab. %   Monocytes 6 Not Estab. %   Eos 3 Not Estab. %   Basos 0 Not Estab. %   Neutrophils Absolute 5.4 1.4 - 7.0 x10E3/uL   Lymphocytes Absolute 1.8 0.7 - 3.1 x10E3/uL   Monocytes Absolute 0.5 0.1 - 0.9 x10E3/uL   EOS (ABSOLUTE) 0.3 0.0 - 0.4 x10E3/uL   Basophils Absolute 0.0 0.0 - 0.2 x10E3/uL   Immature Granulocytes 0 Not Estab. %   Immature Grans (Abs) 0.0 0.0 - 0.1 x10E3/uL  Comprehensive metabolic panel  Result Value Ref Range   Glucose 93 65 - 99 mg/dL   BUN 16 6 - 24 mg/dL   Creatinine, Ser 9.140.82 0.76 - 1.27 mg/dL   GFR calc non Af Amer 108 >59 mL/min/1.73   GFR calc Af Amer 125 >59 mL/min/1.73   BUN/Creatinine Ratio 20 9 - 20   Sodium 139 134 - 144 mmol/L   Potassium 4.2 3.5 - 5.2 mmol/L   Chloride 103 96 - 106 mmol/L   CO2 20 20 - 29  mmol/L   Calcium 9.4 8.7 - 10.2 mg/dL   Total Protein 6.8 6.0 - 8.5 g/dL   Albumin 4.5 3.5 - 5.5 g/dL   Globulin, Total 2.3 1.5 - 4.5 g/dL   Albumin/Globulin Ratio  2.0 1.2 - 2.2   Bilirubin Total 0.4 0.0 - 1.2 mg/dL   Alkaline Phosphatase 80 39 - 117 IU/L   AST 16 0 - 40 IU/L   ALT 16 0 - 44 IU/L  Lipid Panel w/o Chol/HDL Ratio  Result Value Ref Range   Cholesterol, Total 192 100 - 199 mg/dL   Triglycerides 267 0 - 149 mg/dL   HDL 46 >12 mg/dL   VLDL Cholesterol Cal 24 5 - 40 mg/dL   LDL Calculated 458 (H) 0 - 99 mg/dL  TSH  Result Value Ref Range   TSH 0.600 0.450 - 4.500 uIU/mL  UA/M w/rflx Culture, Routine  Result Value Ref Range   Specific Gravity, UA 1.020 1.005 - 1.030   pH, UA 5.5 5.0 - 7.5   Color, UA Yellow Yellow   Appearance Ur Clear Clear   Leukocytes, UA Negative Negative   Protein, UA Negative Negative/Trace   Glucose, UA Negative Negative   Ketones, UA Negative Negative   RBC, UA Negative Negative   Bilirubin, UA Negative Negative   Urobilinogen, Ur 0.2 0.2 - 1.0 mg/dL   Nitrite, UA Negative Negative      Assessment & Plan:   Problem List Items Addressed This Visit      Other   Anxiety disorder    Under good control on current regimen. Continue current regimen. Continue to monitor. Call with any concerns. Refills given. Refill of lorazepam given today- should last about 6 months.        Relevant Medications   sertraline (ZOLOFT) 100 MG tablet   LORazepam (ATIVAN) 0.5 MG tablet    Other Visit Diagnoses    Snoring    -  Primary   Needs sleep study to r/o OSA- will get set up for home if possible. Call with any concerns. Await results.    Relevant Orders   Ambulatory referral to Sleep Studies       Follow up plan: Return in about 6 months (around 04/13/2019) for Physical.    . This visit was completed via Mychart due to the restrictions of the COVID-19 pandemic. All issues as above were discussed and addressed. Physical exam was done as above  through visual confirmation on MyChart. If it was felt that the patient should be evaluated in the office, they were directed there. The patient verbally consented to this visit. . Location of the patient: home . Location of the provider: work . Those involved with this call:  . Provider: Olevia Perches, DO . CMA: Wilhemena Durie, CMA . Front Desk/Registration: Adela Ports  . Time spent on call: 25 minutes with patient face to face via video conference. More than 50% of this time was spent in counseling and coordination of care. 40 minutes total spent in review of patient's record and preparation of their chart.

## 2018-11-05 ENCOUNTER — Other Ambulatory Visit: Payer: Self-pay | Admitting: Family Medicine

## 2018-11-05 NOTE — Telephone Encounter (Signed)
Pt requests change of pharmacy Requested Prescriptions  Pending Prescriptions Disp Refills  . sertraline (ZOLOFT) 100 MG tablet [Pharmacy Med Name: SERTRALINE HCL 100 MG TABLET] 90 tablet 0    Sig: TAKE 1/2 TAB BY MOUTH A WEEK FOR 2 WEEKS, THEN INCREASE TO 1 TAB DAILY     Psychiatry:  Antidepressants - SSRI Failed - 11/05/2018  3:33 PM      Failed - Valid encounter within last 6 months    Recent Outpatient Visits          8 months ago Routine general medical examination at a health care facility   [facility], [place] P, DO   1 year ago Allergic contact dermatitis, unspecified trigger   [facility] Dorcas Carrow, DO   1 year ago Routine general medical examination at a health care facility   [facility], [place] P, DO   2 years ago Injury of finger of left hand, initial encounter   [facility] Particia Nearing, [place]   2 years ago Chronic fatigue   [facility], [place], DO      Future Appointments            In 5 months Johnson, Megan P, DO [facility], [facility]           Failed - Completed PHQ-2 or PHQ-9 in the last 360 days.

## 2018-11-08 LAB — PULMONARY FUNCTION TEST

## 2019-01-20 ENCOUNTER — Encounter: Payer: Self-pay | Admitting: Family Medicine

## 2019-01-24 NOTE — Telephone Encounter (Signed)
Called pt but number was inactive.

## 2019-01-26 NOTE — Telephone Encounter (Signed)
Called number but pt not available, just kept ringing.

## 2019-04-17 ENCOUNTER — Encounter: Payer: Self-pay | Admitting: Family Medicine

## 2019-04-25 ENCOUNTER — Other Ambulatory Visit: Payer: Self-pay

## 2019-04-26 ENCOUNTER — Encounter: Payer: Self-pay | Admitting: Family Medicine

## 2019-05-12 ENCOUNTER — Other Ambulatory Visit: Payer: Self-pay

## 2019-05-12 ENCOUNTER — Encounter: Payer: Self-pay | Admitting: Family Medicine

## 2019-05-12 ENCOUNTER — Ambulatory Visit (INDEPENDENT_AMBULATORY_CARE_PROVIDER_SITE_OTHER): Payer: PRIVATE HEALTH INSURANCE | Admitting: Family Medicine

## 2019-05-12 VITALS — BP 151/100 | HR 65 | Temp 98.1°F | Ht 74.2 in | Wt 216.0 lb

## 2019-05-12 DIAGNOSIS — Z720 Tobacco use: Secondary | ICD-10-CM | POA: Diagnosis not present

## 2019-05-12 DIAGNOSIS — R030 Elevated blood-pressure reading, without diagnosis of hypertension: Secondary | ICD-10-CM

## 2019-05-12 DIAGNOSIS — F411 Generalized anxiety disorder: Secondary | ICD-10-CM

## 2019-05-12 DIAGNOSIS — Z0000 Encounter for general adult medical examination without abnormal findings: Secondary | ICD-10-CM

## 2019-05-12 DIAGNOSIS — K219 Gastro-esophageal reflux disease without esophagitis: Secondary | ICD-10-CM

## 2019-05-12 LAB — UA/M W/RFLX CULTURE, ROUTINE
Bilirubin, UA: NEGATIVE
Glucose, UA: NEGATIVE
Ketones, UA: NEGATIVE
Leukocytes,UA: NEGATIVE
Nitrite, UA: NEGATIVE
Protein,UA: NEGATIVE
RBC, UA: NEGATIVE
Specific Gravity, UA: 1.025 (ref 1.005–1.030)
Urobilinogen, Ur: 4 mg/dL — ABNORMAL HIGH (ref 0.2–1.0)
pH, UA: 7 (ref 5.0–7.5)

## 2019-05-12 MED ORDER — SERTRALINE HCL 100 MG PO TABS: ORAL_TABLET | ORAL | 1 refills | Status: DC

## 2019-05-12 MED ORDER — ESOMEPRAZOLE MAGNESIUM 40 MG PO CPDR: 40.0000 mg | DELAYED_RELEASE_CAPSULE | Freq: Every day | ORAL | 3 refills | Status: DC

## 2019-05-12 MED ORDER — LORAZEPAM 0.5 MG PO TABS: 0.5000 mg | ORAL_TABLET | Freq: Every day | ORAL | 0 refills | Status: DC | PRN

## 2019-05-12 NOTE — Progress Notes (Signed)
BP (!) 151/100   Pulse 65   Temp 98.1 F (36.7 C) (Oral)   Ht 6' 2.2" (1.885 m)   Wt 216 lb (98 kg)   SpO2 97%   BMI 27.58 kg/m    Subjective:    Patient ID: Shawn Andrade, male    DOB: 09/04/1975, 43 y.o.   MRN: [id number]  HPI: Shawn Andrade is a 43 y.o. male presenting on 05/12/2019 for comprehensive medical examination. Current medical complaints include:see below  BPs always run high per patient, checks at work 150s/90s. He states this is happening because his diet is terrible and he doesn't exercise and smokes. He does not wish to be on medications at this time for it. Denies CP, SOB, HAs, dizziness.   Anxiety - Zoloft helping quite a bit. Still taking the ativan prn, but rarely. 20 tabs seems to last him several months at a time. Denies SI/HI, significant mood concerns, side effects with medicines.   GERD - taking otc nexium, wanting rx sent due to cost. Seems to work well for him with minimal breakthrough sxs.   He currently lives with: Interim Problems from his last visit: no  Depression Screen done today and results listed below:  [facility] 05/12/2019 10/11/2018 02/10/2018 09/14/2017 07/01/2015  Decreased Interest 0 0 0 0 0  Down, Depressed, Hopeless 0 0 0 0 0  PHQ - 2 Score 0 0 0 0 0  Altered sleeping 0 1 0 0 -  Tired, decreased energy 0 1 1 1  -  Change in appetite 0 0 0 0 -  Feeling bad or failure about yourself  0 0 0 0 -  Trouble concentrating 0 0 0 0 -  Moving slowly or fidgety/restless 0 0 0 0 -  Suicidal thoughts 0 0 0 0 -  PHQ-9 Score 0 2 1 1  -  Difficult doing work/chores - Not difficult at all Not difficult at all Not difficult at all -    The patient does not have a history of falls. I did complete a risk assessment for falls. A plan of care for falls was documented.   Past Medical History:  Past Medical History:  Diagnosis Date  . Allergy   . Anxiety   . Elevated blood pressure   . IFG (impaired fasting glucose)     Surgical  History:  Past Surgical History:  Procedure Laterality Date  . NO PAST SURGERIES      Medications:  No current outpatient medications on file prior to visit.   No current facility-administered medications on file prior to visit.     Allergies:  No Known Allergies  Social History:  Social History   Socioeconomic History  . Marital status: Married    Spouse name: Not on file  . Number of children: 2  . Years of education: Not on file  . Highest education level: Not on file  Occupational History  . Occupation: FIREFIGHTER    Associate Professormployer: [employer] [place]  Social Needs  . Financial resource strain: Not on file  . Food insecurity    Worry: Not on file    Inability: Not on file  . Transportation needs    Medical: Not on file    Non-medical: Not on file  Tobacco Use  . Smoking status: Current Every Day Smoker    Packs/day: 1.00    Years: 15.00    Pack years: 15.00    Types: Cigarettes  . Smokeless tobacco: Current User  Types: Snuff  Substance and Sexual Activity  . Alcohol use: Yes    Alcohol/week: 0.0 standard drinks    Comment: one or less per day, rare  . Drug use: No  . Sexual activity: Yes  Lifestyle  . Physical activity    Days per week: Not on file    Minutes per session: Not on file  . Stress: Not on file  Relationships  . Social Musician on phone: Not on file    Gets together: Not on file    Attends religious service: Not on file    Active member of club or organization: Not on file    Attends meetings of clubs or organizations: Not on file    Relationship status: Not on file  . Intimate partner violence    Fear of current or ex partner: Not on file    Emotionally abused: Not on file    Physically abused: Not on file    Forced sexual activity: Not on file  Other Topics Concern  . Not on file  Social History Narrative  . Not on file   Social History   Tobacco Use  Smoking Status Current Every Day Smoker  . Packs/day: 1.00  .  Years: 15.00  . Pack years: 15.00  . Types: Cigarettes  Smokeless Tobacco Current User  . Types: Snuff   Social History   Substance and Sexual Activity  Alcohol Use Yes  . Alcohol/week: 0.0 standard drinks   Comment: one or less per day, rare    Family History:  Family History  Problem Relation Age of Onset  . Hypertension Father   . Hypertension Mother   . Anxiety disorder Mother   . Cancer Paternal Grandfather     Past medical history, surgical history, medications, allergies, family history and social history reviewed with patient today and changes made to appropriate areas of the chart.   Review of Systems - General ROS: negative Psychological ROS: positive for - anxiety Ophthalmic ROS: negative ENT ROS: negative Allergy and Immunology ROS: negative Hematological and Lymphatic ROS: negative Endocrine ROS: negative Breast ROS: negative for breast lumps Respiratory ROS: no cough, shortness of breath, or wheezing Cardiovascular ROS: no chest pain or dyspnea on exertion Gastrointestinal ROS: no abdominal pain, change in bowel habits, or black or bloody stools Genito-Urinary ROS: no dysuria, trouble voiding, or hematuria Musculoskeletal ROS: negative Neurological ROS: no TIA or stroke symptoms Dermatological ROS: negative All other ROS negative except what is listed above and in the HPI.      Objective:    BP (!) 151/100   Pulse 65   Temp 98.1 F (36.7 C) (Oral)   Ht 6' 2.2" (1.885 m)   Wt 216 lb (98 kg)   SpO2 97%   BMI 27.58 kg/m   Wt Readings from Last 3 Encounters:  05/12/19 216 lb (98 kg)  02/10/18 207 lb (93.9 kg)  09/14/17 211 lb (95.7 kg)    Physical Exam Vitals signs and nursing note reviewed.  Constitutional:      General: He is not in acute distress.    Appearance: He is well-developed.  HENT:     Head: Atraumatic.     Right Ear: Tympanic membrane and external ear normal.     Left Ear: Tympanic membrane and external ear normal.     Nose:  Nose normal.     Mouth/Throat:     Mouth: Mucous membranes are moist.     Pharynx: Oropharynx is  clear.  Eyes:     General: No scleral icterus.    Conjunctiva/sclera: Conjunctivae normal.     Pupils: Pupils are equal, round, and reactive to light.  Neck:     Musculoskeletal: Normal range of motion and neck supple.  Cardiovascular:     Rate and Rhythm: Normal rate and regular rhythm.     Heart sounds: Normal heart sounds. No murmur.  Pulmonary:     Effort: Pulmonary effort is normal. No respiratory distress.     Breath sounds: Normal breath sounds.  Abdominal:     General: Bowel sounds are normal. There is no distension.     Palpations: Abdomen is soft. There is no mass.     Tenderness: There is no abdominal tenderness. There is no guarding.  Genitourinary:    Comments: GU exam declined Musculoskeletal: Normal range of motion.        General: No tenderness.  Skin:    General: Skin is warm and dry.     Findings: No rash.  Neurological:     General: No focal deficit present.     Mental Status: He is alert and oriented to person, place, and time.     Deep Tendon Reflexes: Reflexes are normal and symmetric.  Psychiatric:        Mood and Affect: Mood normal.        Behavior: Behavior normal.        Thought Content: Thought content normal.        Judgment: Judgment normal.     Results for orders placed or performed in visit on 05/12/19  CBC with Differential/Platelet out  Result Value Ref Range   WBC 6.9 3.4 - 10.8 x10E3/uL   RBC 5.28 4.14 - 5.80 x10E6/uL   Hemoglobin 15.1 13.0 - 17.7 g/dL   Hematocrit 18.8 41.6 - 51.0 %   MCV 85 79 - 97 fL   MCH 28.6 26.6 - 33.0 pg   MCHC 33.7 31.5 - 35.7 g/dL   RDW 60.6 30.1 - 60.1 %   Platelets 272 150 - 450 x10E3/uL   Neutrophils 55 Not Estab. %   Lymphs 34 Not Estab. %   Monocytes 6 Not Estab. %   Eos 5 Not Estab. %   Basos 0 Not Estab. %   Neutrophils Absolute 3.8 1.4 - 7.0 x10E3/uL   Lymphocytes Absolute 2.3 0.7 - 3.1 x10E3/uL    Monocytes Absolute 0.4 0.1 - 0.9 x10E3/uL   EOS (ABSOLUTE) 0.3 0.0 - 0.4 x10E3/uL   Basophils Absolute 0.0 0.0 - 0.2 x10E3/uL   Immature Granulocytes 0 Not Estab. %   Immature Grans (Abs) 0.0 0.0 - 0.1 x10E3/uL  Comprehensive metabolic panel  Result Value Ref Range   Glucose 98 65 - 99 mg/dL   BUN 15 6 - 24 mg/dL   Creatinine, Ser 0.93 0.76 - 1.27 mg/dL   GFR calc non Af Amer 104 >59 mL/min/1.73   GFR calc Af Amer 120 >59 mL/min/1.73   BUN/Creatinine Ratio 17 9 - 20   Sodium 141 134 - 144 mmol/L   Potassium 4.1 3.5 - 5.2 mmol/L   Chloride 101 96 - 106 mmol/L   CO2 24 20 - 29 mmol/L   Calcium 9.7 8.7 - 10.2 mg/dL   Total Protein 6.8 6.0 - 8.5 g/dL   Albumin 4.5 4.0 - 5.0 g/dL   Globulin, Total 2.3 1.5 - 4.5 g/dL   Albumin/Globulin Ratio 2.0 1.2 - 2.2   Bilirubin Total 0.3 0.0 - 1.2 mg/dL  Alkaline Phosphatase 105 39 - 117 IU/L   AST 17 0 - 40 IU/L   ALT 17 0 - 44 IU/L  Lipid Panel w/o Chol/HDL Ratio out  Result Value Ref Range   Cholesterol, Total 223 (H) 100 - 199 mg/dL   Triglycerides 238 (H) 0 - 149 mg/dL   HDL 43 >39 mg/dL   VLDL Cholesterol Cal 43 (H) 5 - 40 mg/dL   LDL Chol Calc (NIH) 137 (H) 0 - 99 mg/dL  TSH  Result Value Ref Range   TSH 0.664 0.450 - 4.500 uIU/mL  UA/M w/rflx Culture, Routine   Specimen: Urine   URINE  Result Value Ref Range   Specific Gravity, UA 1.025 1.005 - 1.030   pH, UA 7.0 5.0 - 7.5   Color, UA Yellow Yellow   Appearance Ur Clear Clear   Leukocytes,UA Negative Negative   Protein,UA Negative Negative/Trace   Glucose, UA Negative Negative   Ketones, UA Negative Negative   RBC, UA Negative Negative   Bilirubin, UA Negative Negative   Urobilinogen, Ur 4.0 (H) 0.2 - 1.0 mg/dL   Nitrite, UA Negative Negative      Assessment & Plan:   Problem List Items Addressed This Visit      Digestive   GERD (gastroesophageal reflux disease)    Under good control, continue nexium regimen      Relevant Medications   esomeprazole (NEXIUM)  40 MG capsule     Other   Anxiety disorder - Primary    Stable and under good control, continue current regimen      Relevant Medications   sertraline (ZOLOFT) 100 MG tablet   LORazepam (ATIVAN) 0.5 MG tablet   Tobacco abuse    Not ready to quit, recommended cessation and counseling given      Elevated blood pressure reading    Discussed risk vs benefit with adding a medication. He declines at this time and will work on lifestyle habits and reducing smoking. Continue prn checks at work and call with persistent abnormalities       Other Visit Diagnoses    Annual physical exam       Relevant Orders   CBC with Differential/Platelet out (Completed)   Comprehensive metabolic panel (Completed)   Lipid Panel w/o Chol/HDL Ratio out (Completed)   TSH (Completed)   UA/M w/rflx Culture, Routine (Completed)       Discussed aspirin prophylaxis for myocardial infarction prevention and decision was it was not indicated  LABORATORY TESTING:  Health maintenance labs ordered today as discussed above.   The natural history of prostate cancer and ongoing controversy regarding screening and potential treatment outcomes of prostate cancer has been discussed with the patient. The meaning of a false positive PSA and a false negative PSA has been discussed. He indicates understanding of the limitations of this screening test and wishes not to proceed with screening PSA testing.   IMMUNIZATIONS:   - Tdap: Tetanus vaccination status reviewed: last tetanus booster within 10 years. - Influenza: Refused  PATIENT COUNSELING:    Sexuality: Discussed sexually transmitted diseases, partner selection, use of condoms, avoidance of unintended pregnancy  and contraceptive alternatives.   Advised to avoid cigarette smoking.  I discussed with the patient that most people either abstain from alcohol or drink within safe limits (<=14/week and <=4 drinks/occasion for males, <=7/weeks and <= 3 drinks/occasion  for females) and that the risk for alcohol disorders and other health effects rises proportionally with the number of drinks per week  and how often a drinker exceeds daily limits.  Discussed cessation/primary prevention of drug use and availability of treatment for abuse.   Diet: Encouraged to adjust caloric intake to maintain  or achieve ideal body weight, to reduce intake of dietary saturated fat and total fat, to limit sodium intake by avoiding high sodium foods and not adding table salt, and to maintain adequate dietary potassium and calcium preferably from fresh fruits, vegetables, and low-fat dairy products.    stressed the importance of regular exercise  Injury prevention: Discussed safety belts, safety helmets, smoke detector, smoking near bedding or upholstery.   Dental health: Discussed importance of regular tooth brushing, flossing, and dental visits.   Follow up plan: NEXT PREVENTATIVE PHYSICAL DUE IN 1 YEAR. Return in about 6 months (around 11/10/2019) for 6 month f/u.

## 2019-05-13 LAB — CBC WITH DIFFERENTIAL/PLATELET
Basophils Absolute: 0 10*3/uL (ref 0.0–0.2)
Basos: 0 %
EOS (ABSOLUTE): 0.3 10*3/uL (ref 0.0–0.4)
Eos: 5 %
Hematocrit: 44.8 % (ref 37.5–51.0)
Hemoglobin: 15.1 g/dL (ref 13.0–17.7)
Immature Grans (Abs): 0 10*3/uL (ref 0.0–0.1)
Immature Granulocytes: 0 %
Lymphocytes Absolute: 2.3 10*3/uL (ref 0.7–3.1)
Lymphs: 34 %
MCH: 28.6 pg (ref 26.6–33.0)
MCHC: 33.7 g/dL (ref 31.5–35.7)
MCV: 85 fL (ref 79–97)
Monocytes Absolute: 0.4 10*3/uL (ref 0.1–0.9)
Monocytes: 6 %
Neutrophils Absolute: 3.8 10*3/uL (ref 1.4–7.0)
Neutrophils: 55 %
Platelets: 272 10*3/uL (ref 150–450)
RBC: 5.28 x10E6/uL (ref 4.14–5.80)
RDW: 13.1 % (ref 11.6–15.4)
WBC: 6.9 10*3/uL (ref 3.4–10.8)

## 2019-05-13 LAB — COMPREHENSIVE METABOLIC PANEL
ALT: 17 IU/L (ref 0–44)
AST: 17 IU/L (ref 0–40)
Albumin/Globulin Ratio: 2 (ref 1.2–2.2)
Albumin: 4.5 g/dL (ref 4.0–5.0)
Alkaline Phosphatase: 105 IU/L (ref 39–117)
BUN/Creatinine Ratio: 17 (ref 9–20)
BUN: 15 mg/dL (ref 6–24)
Bilirubin Total: 0.3 mg/dL (ref 0.0–1.2)
CO2: 24 mmol/L (ref 20–29)
Calcium: 9.7 mg/dL (ref 8.7–10.2)
Chloride: 101 mmol/L (ref 96–106)
Creatinine, Ser: 0.9 mg/dL (ref 0.76–1.27)
GFR calc Af Amer: 120 mL/min/{1.73_m2} (ref >59)
GFR calc non Af Amer: 104 mL/min/{1.73_m2} (ref >59)
Globulin, Total: 2.3 g/dL (ref 1.5–4.5)
Glucose: 98 mg/dL (ref 65–99)
Potassium: 4.1 mmol/L (ref 3.5–5.2)
Sodium: 141 mmol/L (ref 134–144)
Total Protein: 6.8 g/dL (ref 6.0–8.5)

## 2019-05-13 LAB — TSH: TSH: 0.664 u[IU]/mL (ref 0.450–4.500)

## 2019-05-13 LAB — LIPID PANEL W/O CHOL/HDL RATIO
Cholesterol, Total: 223 mg/dL — ABNORMAL HIGH (ref 100–199)
HDL: 43 mg/dL (ref >39)
LDL Chol Calc (NIH): 137 mg/dL — ABNORMAL HIGH (ref 0–99)
Triglycerides: 238 mg/dL — ABNORMAL HIGH (ref 0–149)
VLDL Cholesterol Cal: 43 mg/dL — ABNORMAL HIGH (ref 5–40)

## 2019-05-17 DIAGNOSIS — K219 Gastro-esophageal reflux disease without esophagitis: Secondary | ICD-10-CM | POA: Insufficient documentation

## 2019-05-17 DIAGNOSIS — R030 Elevated blood-pressure reading, without diagnosis of hypertension: Secondary | ICD-10-CM | POA: Insufficient documentation

## 2019-05-17 NOTE — Assessment & Plan Note (Signed)
Not ready to quit, recommended cessation and counseling given

## 2019-05-17 NOTE — Assessment & Plan Note (Signed)
Stable and under good control, continue current regimen 

## 2019-05-17 NOTE — Assessment & Plan Note (Signed)
Under good control, continue nexium regimen

## 2019-05-17 NOTE — Assessment & Plan Note (Signed)
Discussed risk vs benefit with adding a medication. He declines at this time and will work on lifestyle habits and reducing smoking. Continue prn checks at work and call with persistent abnormalities

## 2019-11-02 ENCOUNTER — Encounter: Payer: Self-pay | Admitting: Family Medicine

## 2019-11-02 ENCOUNTER — Ambulatory Visit: Payer: PRIVATE HEALTH INSURANCE | Admitting: Family Medicine

## 2019-11-23 ENCOUNTER — Encounter: Payer: Self-pay | Admitting: Family Medicine

## 2019-12-13 ENCOUNTER — Other Ambulatory Visit: Payer: Self-pay | Admitting: Family Medicine

## 2019-12-13 NOTE — Telephone Encounter (Signed)
Requested Prescriptions  Pending Prescriptions Disp Refills  . sertraline (ZOLOFT) 100 MG tablet [Pharmacy Med Name: SERTRALINE HCL 100 MG TABLET] 30 tablet 0    Sig: TAKE 1 TABLET BY MOUTH EVERY DAY     Psychiatry:  Antidepressants - SSRI Failed - 12/13/2019  1:27 AM      Failed - Valid encounter within last 6 months    Recent Outpatient Visits          7 months ago Generalized anxiety disorder   [facility] Particia N[place], [place]   1 year ago Snoring   [facility] [place], [place], [place]   1 year ago Routine general medical examination at a health care facility   [facility], [place] P, DO   2 years ago Allergic contact dermatitis, unspecified trigger   [facility] [place], Megan P, DO   2 years ago Routine general medical examination at a health care facility   [facility] [place], [place], DO      Future Appointments            In 5 months Laural Benes, Oralia Rud, DO [facility]           One month courtesy refill provided with reminder for patient to schedule a 6 month f/u appointment.

## 2020-01-12 ENCOUNTER — Other Ambulatory Visit: Payer: Self-pay | Admitting: Family Medicine

## 2020-01-13 ENCOUNTER — Other Ambulatory Visit: Payer: Self-pay | Admitting: Family Medicine

## 2020-05-13 ENCOUNTER — Ambulatory Visit (INDEPENDENT_AMBULATORY_CARE_PROVIDER_SITE_OTHER): Payer: PRIVATE HEALTH INSURANCE | Admitting: Family Medicine

## 2020-05-13 ENCOUNTER — Encounter: Payer: Self-pay | Admitting: Family Medicine

## 2020-05-13 ENCOUNTER — Other Ambulatory Visit: Payer: Self-pay

## 2020-05-13 VITALS — BP 120/76 | HR 65 | Temp 98.3°F | Ht 74.69 in | Wt 205.6 lb

## 2020-05-13 DIAGNOSIS — Z0000 Encounter for general adult medical examination without abnormal findings: Secondary | ICD-10-CM

## 2020-05-13 DIAGNOSIS — F411 Generalized anxiety disorder: Secondary | ICD-10-CM | POA: Diagnosis not present

## 2020-05-13 DIAGNOSIS — Z1211 Encounter for screening for malignant neoplasm of colon: Secondary | ICD-10-CM | POA: Diagnosis not present

## 2020-05-13 LAB — URINALYSIS, ROUTINE W REFLEX MICROSCOPIC
Bilirubin, UA: NEGATIVE
Glucose, UA: NEGATIVE
Ketones, UA: NEGATIVE
Leukocytes,UA: NEGATIVE
Nitrite, UA: NEGATIVE
Protein,UA: NEGATIVE
RBC, UA: NEGATIVE
Specific Gravity, UA: 1.015 (ref 1.005–1.030)
Urobilinogen, Ur: 1 mg/dL (ref 0.2–1.0)
pH, UA: 5.5 (ref 5.0–7.5)

## 2020-05-13 MED ORDER — LORAZEPAM 0.5 MG PO TABS
0.5000 mg | ORAL_TABLET | Freq: Every day | ORAL | 0 refills | Status: DC | PRN
Start: 2020-05-13 — End: 2020-07-16

## 2020-05-13 MED ORDER — ESOMEPRAZOLE MAGNESIUM 40 MG PO CPDR
40.0000 mg | DELAYED_RELEASE_CAPSULE | Freq: Every day | ORAL | 3 refills | Status: DC
Start: 2020-05-13 — End: 2021-01-09

## 2020-05-13 MED ORDER — SERTRALINE HCL 100 MG PO TABS: ORAL_TABLET | ORAL | 1 refills | Status: DC

## 2020-05-13 NOTE — Progress Notes (Signed)
BP 120/76   Pulse 65   Temp 98.3 F (36.8 C) (Oral)   Ht 6' 2.69" (1.897 m)   Wt 205 lb 9.6 oz (93.3 kg)   BMI 25.92 kg/m    Subjective:    Patient ID: Shawn Andrade, male    DOB: Jan 30, 1976, 44 y.o.   MRN: [id number]  HPI: Shawn Andrade is a 44 y.o. male presenting on 05/13/2020 for comprehensive medical examination. Current medical complaints include:  ANXIETY/STRESS Duration: Chronic Status:controlled Anxious mood: yes  Excessive worrying: yes Irritability: no  Sweating: no Nausea: no Palpitations:no Hyperventilation: no Panic attacks: no Agoraphobia: no  Obscessions/compulsions: no Depressed mood: no Depression screen [facility] 2/9 05/13/2020 05/12/2019 10/11/2018 02/10/2018 09/14/2017  Decreased Interest 0 0 0 0 0  Down, Depressed, Hopeless 0 0 0 0 0  PHQ - 2 Score 0 0 0 0 0  Altered sleeping 0 0 1 0 0  Tired, decreased energy 1 0 Change in appetite 0 0 0 0 0  Feeling bad or failure about yourself  0 0 0 0 0  Trouble concentrating 0 0 0 0 0  Moving slowly or fidgety/restless 0 0 0 0 0  Suicidal thoughts 0 0 0 0 0  PHQ-9 Score 1 0 Difficult doing work/chores Not difficult at all - Not difficult at all Not difficult at all Not difficult at all   Anhedonia: no Weight changes: no Insomnia: no   Hypersomnia: no Fatigue/loss of energy: yes Feelings of worthlessness: no Feelings of guilt: no Impaired concentration/indecisiveness: no Suicidal ideations: no  Crying spells: no Recent Stressors/Life Changes: no   Relationship problems: no   Family stress: no     Financial stress: no    Job stress: no    Recent death/loss: no  He currently lives with: wife and daughters Interim Problems from his last visit: no  Depression Screen done today and results listed below:  Depression screen [facility] 2/9 05/13/2020 05/12/2019 10/11/2018 02/10/2018 09/14/2017  Decreased Interest 0 0 0 0 0  Down, Depressed, Hopeless 0 0 0 0 0  PHQ - 2 Score 0 0 0 0 0  Altered sleeping 0 0 1  0 0  Tired, decreased energy 1 0 Change in appetite 0 0 0 0 0  Feeling bad or failure about yourself  0 0 0 0 0  Trouble concentrating 0 0 0 0 0  Moving slowly or fidgety/restless 0 0 0 0 0  Suicidal thoughts 0 0 0 0 0  PHQ-9 Score 1 0 Difficult doing work/chores Not difficult at all - Not difficult at all Not difficult at all Not difficult at all    Past Medical History:  Past Medical History:  Diagnosis Date  . Allergy   . Anxiety   . Elevated blood pressure   . IFG (impaired fasting glucose)     Surgical History:  Past Surgical History:  Procedure Laterality Date  . NO PAST SURGERIES      Medications:  No current outpatient medications on file prior to visit.   No current facility-administered medications on file prior to visit.    Allergies:  No Known Allergies  Social History:  Social History   Socioeconomic History  . Marital status: Married    Spouse name: Not on file  . Number of children: 2  . Years of education: Not on file  . Highest education level: Not on file  Occupational History  . Occupation: FIREFIGHTER    Associate Professormployer: [employer]  Tobacco Use  . Smoking status: Current Every Day Smoker    Packs/day: 1.00    Years: 15.00    Pack years: 15.00    Types: Cigarettes  . Smokeless tobacco: Current User    Types: Snuff  Vaping Use  . Vaping Use: Never used  Substance and Sexual Activity  . Alcohol use: Yes    Alcohol/week: 0.0 standard drinks    Comment: one or less per day, rare  . Drug use: No  . Sexual activity: Yes  Other Topics Concern  . Not on file  Social History Narrative  . Not on file   Social Determinants of Health   Financial Resource Strain:   . Difficulty of Paying Living Expenses: Not on file  Food Insecurity:   . Worried About Programme researcher, broadcasting/film/videounning Out of Food in the Last Year: Not on file  . Ran Out of Food in the Last Year: Not on file  Transportation Needs:   . Lack of Transportation (Medical): Not on file  .  Lack of Transportation (Non-Medical): Not on file  Physical Activity:   . Days of Exercise per Week: Not on file  . Minutes of Exercise per Session: Not on file  Stress:   . Feeling of Stress : Not on file  Social Connections:   . Frequency of Communication with Friends and Family: Not on file  . Frequency of Social Gatherings with Friends and Family: Not on file  . Attends Religious Services: Not on file  . Active Member of Clubs or Organizations: Not on file  . Attends BankerClub or Organization Meetings: Not on file  . Marital Status: Not on file  Intimate Partner Violence:   . Fear of Current or Ex-Partner: Not on file  . Emotionally Abused: Not on file  . Physically Abused: Not on file  . Sexually Abused: Not on file   Social History   Tobacco Use  Smoking Status Current Every Day Smoker  . Packs/day: 1.00  . Years: 15.00  . Pack years: 15.00  . Types: Cigarettes  Smokeless Tobacco Current User  . Types: Snuff   Social History   Substance and Sexual Activity  Alcohol Use Yes  . Alcohol/week: 0.0 standard drinks   Comment: one or less per day, rare    Family History:  Family History  Problem Relation Age of Onset  . Hypertension Father   . Hypertension Mother   . Anxiety disorder Mother   . Cancer Paternal Grandfather     Past medical history, surgical history, medications, allergies, family history and social history reviewed with patient today and changes made to appropriate areas of the chart.   Review of Systems  Constitutional: Negative.   HENT: Negative.   Eyes: Negative.   Respiratory: Positive for cough. Negative for hemoptysis, sputum production, shortness of breath and wheezing.   Cardiovascular: Negative.   Gastrointestinal: Positive for heartburn. Negative for abdominal pain, blood in stool, constipation, diarrhea, melena, nausea and vomiting.  Genitourinary: Negative.   Musculoskeletal: Negative.   Skin: Negative.   Neurological: Negative.    Endo/Heme/Allergies: Positive for environmental allergies. Negative for polydipsia. Does not bruise/bleed easily.  Psychiatric/Behavioral: Negative.     All other ROS negative except what is listed above and in the HPI.      Objective:    BP 120/76   Pulse 65   Temp 98.3 F (36.8 C) (Oral)   Ht 6'  2.69" (1.897 m)   Wt 205 lb 9.6 oz (93.3 kg)   BMI 25.92 kg/m   Wt Readings from Last 3 Encounters:  05/13/20 205 lb 9.6 oz (93.3 kg)  05/12/19 216 lb (98 kg)  02/10/18 207 lb (93.9 kg)    Physical Exam Vitals and nursing note reviewed.  Constitutional:      General: He is not in acute distress.    Appearance: Normal appearance. He is not ill-appearing, toxic-appearing or diaphoretic.  HENT:     Head: Normocephalic and atraumatic.     Right Ear: Tympanic membrane, ear canal and external ear normal. There is no impacted cerumen.     Left Ear: Tympanic membrane, ear canal and external ear normal. There is no impacted cerumen.     Nose: Nose normal. No congestion or rhinorrhea.     Mouth/Throat:     Mouth: Mucous membranes are moist.     Pharynx: Oropharynx is clear. No oropharyngeal exudate or posterior oropharyngeal erythema.  Eyes:     General: No scleral icterus.       Right eye: No discharge.        Left eye: No discharge.     Extraocular Movements: Extraocular movements intact.     Conjunctiva/sclera: Conjunctivae normal.     Pupils: Pupils are equal, round, and reactive to light.  Neck:     Vascular: No carotid bruit.  Cardiovascular:     Rate and Rhythm: Normal rate and regular rhythm.     Pulses: Normal pulses.     Heart sounds: No murmur heard.  No friction rub. No gallop.   Pulmonary:     Effort: Pulmonary effort is normal. No respiratory distress.     Breath sounds: Normal breath sounds. No stridor. No wheezing, rhonchi or rales.  Chest:     Chest wall: No tenderness.  Abdominal:     General: Abdomen is flat. Bowel sounds are normal. There is no distension.      Palpations: Abdomen is soft. There is no mass.     Tenderness: There is no abdominal tenderness. There is no right CVA tenderness, left CVA tenderness, guarding or rebound.     Hernia: No hernia is present.  Genitourinary:    Comments: Genital exam deferred with shared decision making Musculoskeletal:        General: No swelling, tenderness, deformity or signs of injury.     Cervical back: Normal range of motion and neck supple. No rigidity. No muscular tenderness.     Right lower leg: No edema.     Left lower leg: No edema.  Lymphadenopathy:     Cervical: No cervical adenopathy.  Skin:    General: Skin is warm and dry.     Capillary Refill: Capillary refill takes less than 2 seconds.     Coloration: Skin is not jaundiced or pale.     Findings: No bruising, erythema, lesion or rash.  Neurological:     General: No focal deficit present.     Mental Status: He is alert and oriented to person, place, and time.     Cranial Nerves: No cranial nerve deficit.     Sensory: No sensory deficit.     Motor: No weakness.     Coordination: Coordination normal.     Gait: Gait normal.     Deep Tendon Reflexes: Reflexes normal.  Psychiatric:        Mood and Affect: Mood normal.        Behavior: Behavior normal.  Thought Content: Thought content normal.        Judgment: Judgment normal.     Results for orders placed or performed in visit on 05/12/19  CBC with Differential/Platelet out  Result Value Ref Range   WBC 6.9 3.4 - 10.8 x10E3/uL   RBC 5.28 4.14 - 5.80 x10E6/uL   Hemoglobin 15.1 13.0 - 17.7 g/dL   Hematocrit 18.8 41.6 - 51.0 %   MCV 85 79 - 97 fL   MCH 28.6 26.6 - 33.0 pg   MCHC 33.7 31 - 35 g/dL   RDW 60.6 30.1 - 60.1 %   Platelets 272 150 - 450 x10E3/uL   Neutrophils 55 Not Estab. %   Lymphs 34 Not Estab. %   Monocytes 6 Not Estab. %   Eos 5 Not Estab. %   Basos 0 Not Estab. %   Neutrophils Absolute 3.8 1.40 - 7.00 x10E3/uL   Lymphocytes Absolute 2.3 0 - 3  x10E3/uL   Monocytes Absolute 0.4 0 - 0 x10E3/uL   EOS (ABSOLUTE) 0.3 0.0 - 0.4 x10E3/uL   Basophils Absolute 0.0 0 - 0 x10E3/uL   Immature Granulocytes 0 Not Estab. %   Immature Grans (Abs) 0.0 0.0 - 0.1 x10E3/uL  Comprehensive metabolic panel  Result Value Ref Range   Glucose 98 65 - 99 mg/dL   BUN 15 6 - 24 mg/dL   Creatinine, Ser 0.93 0.76 - 1.27 mg/dL   GFR calc non Af Amer 104 >59 mL/min/1.73   GFR calc Af Amer 120 >59 mL/min/1.73   BUN/Creatinine Ratio 17 9 - 20   Sodium 141 134 - 144 mmol/L   Potassium 4.1 3.5 - 5.2 mmol/L   Chloride 101 96 - 106 mmol/L   CO2 24 20 - 29 mmol/L   Calcium 9.7 8.7 - 10.2 mg/dL   Total Protein 6.8 6.0 - 8.5 g/dL   Albumin 4.5 4.0 - 5.0 g/dL   Globulin, Total 2.3 1.5 - 4.5 g/dL   Albumin/Globulin Ratio 2.0 1.2 - 2.2   Bilirubin Total 0.3 0.0 - 1.2 mg/dL   Alkaline Phosphatase 105 39 - 117 IU/L   AST 17 0 - 40 IU/L   ALT 17 0 - 44 IU/L  Lipid Panel w/o Chol/HDL Ratio out  Result Value Ref Range   Cholesterol, Total 223 (H) 100 - 199 mg/dL   Triglycerides 235 (H) 0 - 149 mg/dL   HDL 43 >57 mg/dL   VLDL Cholesterol Cal 43 (H) 5 - 40 mg/dL   LDL Chol Calc (NIH) 322 (H) 0 - 99 mg/dL  TSH  Result Value Ref Range   TSH 0.664 0.450 - 4.500 uIU/mL  UA/M w/rflx Culture, Routine   Specimen: Urine   URINE  Result Value Ref Range   Specific Gravity, UA 1.025 1.005 - 1.030   pH, UA 7.0 5.0 - 7.5   Color, UA Yellow Yellow   Appearance Ur Clear Clear   Leukocytes,UA Negative Negative   Protein,UA Negative Negative/Trace   Glucose, UA Negative Negative   Ketones, UA Negative Negative   RBC, UA Negative Negative   Bilirubin, UA Negative Negative   Urobilinogen, Ur 4.0 (H) 0.2 - 1.0 mg/dL   Nitrite, UA Negative Negative      Assessment & Plan:   Problem List Items Addressed This Visit      Other   Anxiety disorder    Under good control on current regimen. Continue current regimen. Continue to monitor. Call with any concerns. Refills  given.  Relevant Medications   sertraline (ZOLOFT) 100 MG tablet   LORazepam (ATIVAN) 0.5 MG tablet    Other Visit Diagnoses    Routine general medical examination at a health care facility    -  Primary   Vaccines declined. Screening labs checked today. Continue diet and exercise. Call with any concerns.    Relevant Orders   CBC with Differential/Platelet   Comprehensive metabolic panel   Lipid Panel w/o Chol/HDL Ratio   TSH   Urinalysis, Routine w reflex microscopic   Hepatitis C Antibody   Screening for colon cancer       Will consider cologuard vs. colonoscopy and let us know.       LABORATORY TESTING:  Health maintenance labs ordered today as discussed above.   IMMUNIZATIONS:   - Tdap: Tetanus vaccination status reviewed: last tetanus booster within 10 years. - Influenza: Refused - Pneumovax: Refused - COVID: Refused  SCREENING: - Colonoscopy: will consider and let us know  Discussed with patient purpose of the colonoscopy is to detect colon cancer at curable precancerous or early stages   PATIENT COUNSELING:    Sexuality: Discussed sexually transmitted diseases, partner selection, use of condoms, avoidance of unintended pregnancy  and contraceptive alternatives.   Advised to avoid cigarette smoking.  I discussed with the patient that most people either abstain from alcohol or drink within safe limits (<=14/week and <=4 drinks/occasion for males, <=7/weeks and <= 3 drinks/occasion for females) and that the risk for alcohol disorders and other health effects rises proportionally with the number of drinks per week and how often a drinker exceeds daily limits.  Discussed cessation/primary prevention of drug use and availability of treatment for abuse.   Diet: Encouraged to adjust caloric intake to maintain  or achieve ideal body weight, to reduce intake of dietary saturated fat and total fat, to limit sodium intake by avoiding high sodium foods and not adding  table salt, and to maintain adequate dietary potassium and calcium preferably from fresh fruits, vegetables, and low-fat dairy products.    stressed the importance of regular exercise  Injury prevention: Discussed safety belts, safety helmets, smoke detector, smoking near bedding or upholstery.   Dental health: Discussed importance of regular tooth brushing, flossing, and dental visits.   Follow up plan: NEXT PREVENTATIVE PHYSICAL DUE IN 1 YEAR. No follow-ups on file.

## 2020-05-13 NOTE — Patient Instructions (Addendum)
Health Maintenance, Male Adopting a healthy lifestyle and getting preventive care are important in promoting health and wellness. Ask your health care provider about:  The right schedule for you to have regular tests and exams.  Things you can do on your own to prevent diseases and keep yourself healthy. What should I know about diet, weight, and exercise? Eat a healthy diet   Eat a diet that includes plenty of vegetables, fruits, low-fat dairy products, and lean protein.  Do not eat a lot of foods that are high in solid fats, added sugars, or sodium. Maintain a healthy weight Body mass index (BMI) is a measurement that can be used to identify possible weight problems. It estimates body fat based on height and weight. Your health care provider can help determine your BMI and help you achieve or maintain a healthy weight. Get regular exercise Get regular exercise. This is one of the most important things you can do for your health. Most adults should:  Exercise for at least 150 minutes each week. The exercise should increase your heart rate and make you sweat (moderate-intensity exercise).  Do strengthening exercises at least twice a week. This is in addition to the moderate-intensity exercise.  Spend less time sitting. Even light physical activity can be beneficial. Watch cholesterol and blood lipids Have your blood tested for lipids and cholesterol at 45 years of age, then have this test every 5 years. You may need to have your cholesterol levels checked more often if:  Your lipid or cholesterol levels are high.  You are older than 45 years of age.  You are at high risk for heart disease. What should I know about cancer screening? Many types of cancers can be detected early and may often be prevented. Depending on your health history and family history, you may need to have cancer screening at various ages. This may include screening for:  Colorectal cancer.  Prostate  cancer.  Skin cancer.  Lung cancer. What should I know about heart disease, diabetes, and high blood pressure? Blood pressure and heart disease  High blood pressure causes heart disease and increases the risk of stroke. This is more likely to develop in people who have high blood pressure readings, are of African descent, or are overweight.  Talk with your health care provider about your target blood pressure readings.  Have your blood pressure checked: ? Every 3-5 years if you are 18-39 years of age. ? Every year if you are 40 years old or older.  If you are between the ages of 65 and 75 and are a current or former smoker, ask your health care provider if you should have a one-time screening for abdominal aortic aneurysm (AAA). Diabetes Have regular diabetes screenings. This checks your fasting blood sugar level. Have the screening done:  Once every three years after age 45 if you are at a normal weight and have a low risk for diabetes.  More often and at a younger age if you are overweight or have a high risk for diabetes. What should I know about preventing infection? Hepatitis B If you have a higher risk for hepatitis B, you should be screened for this virus. Talk with your health care provider to find out if you are at risk for hepatitis B infection. Hepatitis C Blood testing is recommended for:  Everyone born from 1945 through 1965.  Anyone with known risk factors for hepatitis C. Sexually transmitted infections (STIs)  You should be screened each year   for STIs, including gonorrhea and chlamydia, if: ? You are sexually active and are younger than 45 years of age. ? You are older than 45 years of age and your health care provider tells you that you are at risk for this type of infection. ? Your sexual activity has changed since you were last screened, and you are at increased risk for chlamydia or gonorrhea. Ask your health care provider if you are at risk.  Ask your  health care provider about whether you are at high risk for HIV. Your health care provider may recommend a prescription medicine to help prevent HIV infection. If you choose to take medicine to prevent HIV, you should first get tested for HIV. You should then be tested every 3 months for as long as you are taking the medicine. Follow these instructions at home: Lifestyle  Do not use any products that contain nicotine or tobacco, such as cigarettes, e-cigarettes, and chewing tobacco. If you need help quitting, ask your health care provider.  Do not use street drugs.  Do not share needles.  Ask your health care provider for help if you need support or information about quitting drugs. Alcohol use  Do not drink alcohol if your health care provider tells you not to drink.  If you drink alcohol: ? Limit how much you have to 0-2 drinks a day. ? Be aware of how much alcohol is in your drink. In the [place] one drink equals one 12 oz bottle of beer (355 mL), one 5 oz glass of wine (148 mL), or one 1 oz glass of hard liquor (44 mL). General instructions  Schedule regular health, dental, and eye exams.  Stay current with your vaccines.  Tell your health care provider if: ? You often feel depressed. ? You have ever been abused or do not feel safe at home. Summary  Adopting a healthy lifestyle and getting preventive care are important in promoting health and wellness.  Follow your health care provider's instructions about healthy diet, exercising, and getting tested or screened for diseases.  Follow your health care provider's instructions on monitoring your cholesterol and blood pressure. This information is not intended to replace advice given to you by your health care provider. Make sure you discuss any questions you have with your health care provider. Document Revised: 05/18/2018 Document Reviewed: 05/18/2018 Elsevier Patient Education  [employer].  Colonoscopy, Adult A  colonoscopy is an exam to look at the large intestine. It is done using a long, thin, flexible tube that has a camera on the end. This exam is done to check for problems, such as:  Lumps (tumors).  Growths (polyps).  Irritation and swelling (inflammation).  Bleeding. Tell your doctor about:  Any allergies you have.  All medicines you are taking. Tell him or her about vitamins, herbs, eye drops, creams, and over-the-counter medicines.  Any problems you or family members have had with anesthetic medicines.  Any blood disorders you have.  Any surgeries you have had.  Any medical conditions you have.  Whether you are pregnant or may be pregnant.  Any problems you have had with pooping (having bowel movements). What are the risks? Generally, this is a safe procedure. However, problems may occur, such as:  Bleeding.  Damage to your intestine.  Allergic reactions to medicines given during the procedure.  Infection. This is rare. What happens before the procedure? Eating and drinking Follow instructions from your doctor about eating and drinking. These may include:  A few days before the procedure: ? Follow a low-fiber diet. ? Avoid these foods:  Nuts.  Seeds.  Dried fruit.  Raw fruits.  Vegetables.  1-3 days before the procedure: ? Eat only gelatin dessert or ice pops. ? Drink only clear liquids, such as:  Water.  Clear broth or bouillon.  Black coffee or tea.  Clear juice.  Clear soft drinks or sports drinks. ? Avoid liquids that have red or purple dye.  On the day of the procedure: ? Do not eat solid foods. You may continue to drink clear liquids until up to 2 hours before the procedure. ? Do not eat or drink anything starting 2 hours before the procedure or as told by your doctor. Bowel prep If you were prescribed a bowel prep to take by mouth (orally) to clean out your colon:  Take it as told by your doctor. Starting the day before your  procedure, you will need to drink a lot of liquid medicine. The liquid will cause you to poop until your poop is almost clear or light green.  If your skin or butt gets irritated from diarrhea, you may: ? Wipe the area with wipes that have medicine in them, such as adult wet wipes with aloe and vitamin E. ? Put something on your skin that soothes the area, such as petroleum jelly.  If you vomit while drinking the bowel prep: ? Take a break for up to 60 minutes. ? Begin the bowel prep again. ? Call your doctor if you keep vomiting and you cannot take the bowel prep without vomiting.  To clean out your colon, you may also be given: ? Laxative medicines. These help you poop. ? Instructions for using a liquid medicine (enema) injected into your butt. Medicines Ask your doctor about:  Changing or stopping your normal medicines. This is important.  Taking aspirin and ibuprofen. Do not take these medicines unless your doctor tells you to take them.  Taking over-the-counter medicines, vitamins, herbs, and supplements. General instructions  Ask your doctor what steps will be taken to help prevent the spread of germs. These may include washing skin with a germ-killing soap.  Plan to have someone take you home from the hospital or clinic. What happens during the procedure?   An IV tube may be put into one of your veins.  You may be given one or more of the following: ? A medicine to help you relax (sedative). ? A medicine to numb the area (local anesthetic). ? A medicine to make you fall asleep (general anesthetic). This is rarely needed.  You will lie on your side with your knees bent.  The tube will: ? Have oil or gel put on it. ? Be put into the opening of the butt (anus). ? Be gently put into your large intestine.  Air will be sent into your colon to keep it open. You may feel some pressure or cramping.  The camera will be used to take photos that will appear on a screen.  A  small tissue sample may be removed for testing (biopsy).  If small growths are found, your doctor may remove them and have them checked for cancer.  The tube will be slowly removed. The procedure may vary among doctors and hospitals. What happens after the procedure?  You will be monitored until you leave the hospital or clinic. This includes checking your: ? Blood pressure. ? Heart rate. ? Breathing rate. ? Blood oxygen level.  You  may have a small amount of blood in your poop.  You may pass gas.  You may have mild cramps or bloating in your belly (abdomen).  Do not drive for 24 hours after the procedure.  It is up to you to get the results of your procedure. Ask your doctor, or the department that is doing the procedure, when your results will be ready. Summary  A colonoscopy is an exam to look at the large intestine.  Follow instructions from your doctor about eating and drinking before the procedure.  You may be prescribed an oral bowel prep to clean out your colon. Take it as told by your doctor.  A flexible tube with a camera on its end will be put into the opening of the butt. It will be passed into the large intestine.  You will be monitored until you leave the hospital or clinic. This information is not intended to replace advice given to you by your health care provider. Make sure you discuss any questions you have with your health care provider. Document Revised: 12/16/2018 Document Reviewed: 12/16/2018 Elsevier Patient Education  2020 [employer].  Colorectal Cancer Screening  Colorectal cancer screening is a group of tests that are used to check for colorectal cancer before symptoms develop. Colorectal refers to the colon and rectum. The colon and rectum are located at the end of the digestive tract and carry bowel movements out of the body. Who should have screening? All adults starting at age 12 until age 55 should have screening. Your health care provider  may recommend screening at age 36. You will have tests every 1-10 years, depending on your results and the type of screening test. You may have screening tests starting at an earlier age, or more frequently than other people, if you have any of the following risk factors:  A personal or family history of colorectal cancer or abnormal growths (polyps).  Inflammatory bowel disease, such as ulcerative colitis or Crohn's disease.  A history of having radiation treatment to the abdomen or pelvic area for cancer.  Colorectal cancer symptoms, such as changes in bowel habits or blood in your stool.  A type of colon cancer syndrome that is passed from parent to child (hereditary), such as: ? Lynch syndrome. ? Familial adenomatous polyposis. ? Turcot syndrome. ? Peutz-Jeghers syndrome. Screening recommendations for adults who are 70-31 years old vary depending on health. How is screening done? There are several types of colorectal screening tests. You may have one or more of the following:  Guaiac-based fecal occult blood testing. For this test, a stool (feces) sample is checked for hidden (occult) blood, which could be a sign of colorectal cancer.  Fecal immunochemical test (FIT). For this test, a stool sample is checked for blood, which could be a sign of colorectal cancer.  Stool DNA test. For this test, a stool sample is checked for blood and changes in DNA that could lead to colorectal cancer.  Sigmoidoscopy. During this test, a thin, flexible tube with a camera on the end (sigmoidoscope) is used to examine the rectum and the lower colon.  Colonoscopy. During this test, a long, flexible tube with a camera on the end (colonoscope) is used to examine the entire colon and rectum. With a colonoscopy, it is possible to take a sample of tissue (biopsy) and remove small polyps during the test.  Virtual colonoscopy. Instead of a colonoscope, this type of colonoscopy uses X-rays (CT scan) and  computers to produce images  of the colon and rectum. What are the benefits of screening? Screening reduces your risk for colorectal cancer and can help identify cancer at an early stage, when the cancer can be removed or treated more easily. It is common for polyps to form in the lining of the colon, especially as you age. These polyps may be cancerous or become cancerous over time. Screening can identify these polyps. What are the risks of screening? Each screening test may have different risks.  Stool sample tests have fewer risks than other types of screening tests. However, you may need more tests to confirm results from a stool sample test.  Screening tests that involve X-rays expose you to low levels of radiation, which may slightly increase your cancer risk. The benefit of detecting cancer outweighs the slight increase in risk.  Screening tests such as sigmoidoscopy and colonoscopy may place you at risk for bleeding, intestinal damage, infection, or a reaction to medicines given during the exam. Talk with your health care provider to understand your risk for colorectal cancer and to make a screening plan that is right for you. Questions to ask your health care provider  When should I start colorectal cancer screening?  What is my risk for colorectal cancer?  How often do I need screening?  Which screening tests do I need?  How do I get my test results?  What do my results mean? Where to find more information Learn more about colorectal cancer screening from:  The American Cancer Society: www.cancer.org  [employer]: www.cancer.gov Summary  Colorectal cancer screening is a group of tests used to check for colorectal cancer before symptoms develop.  Screening reduces your risk for colorectal cancer and can help identify cancer at an early stage, when the cancer can be removed or treated more easily.  All adults starting at age 41 until age 46 should have  screening. Your health care provider may recommend screening at age 70.  You may have screening tests starting at an earlier age, or more frequently than other people, if you have certain risk factors.  Talk with your health care provider to understand your risk for colorectal cancer and to make a screening plan that is right for you. This information is not intended to replace advice given to you by your health care provider. Make sure you discuss any questions you have with your health care provider. Document Revised: 09/14/2018 Document Reviewed: 02/24/2017 Elsevier Patient Education  2020 [employer].

## 2020-05-13 NOTE — Assessment & Plan Note (Signed)
Under good control on current regimen. Continue current regimen. Continue to monitor. Call with any concerns. Refills given.   

## 2020-05-14 LAB — COMPREHENSIVE METABOLIC PANEL
ALT: 17 IU/L (ref 0–44)
AST: 16 IU/L (ref 0–40)
Albumin/Globulin Ratio: 1.7 (ref 1.2–2.2)
Albumin: 4.6 g/dL (ref 4.0–5.0)
Alkaline Phosphatase: 80 IU/L (ref 44–121)
BUN/Creatinine Ratio: 18 (ref 9–20)
BUN: 15 mg/dL (ref 6–24)
Bilirubin Total: 0.4 mg/dL (ref 0.0–1.2)
CO2: 22 mmol/L (ref 20–29)
Calcium: 9.7 mg/dL (ref 8.7–10.2)
Chloride: 101 mmol/L (ref 96–106)
Creatinine, Ser: 0.83 mg/dL (ref 0.76–1.27)
GFR calc Af Amer: 123 mL/min/{1.73_m2} (ref >59)
GFR calc non Af Amer: 106 mL/min/{1.73_m2} (ref >59)
Globulin, Total: 2.7 g/dL (ref 1.5–4.5)
Glucose: 98 mg/dL (ref 65–99)
Potassium: 4.1 mmol/L (ref 3.5–5.2)
Sodium: 140 mmol/L (ref 134–144)
Total Protein: 7.3 g/dL (ref 6.0–8.5)

## 2020-05-14 LAB — CBC WITH DIFFERENTIAL/PLATELET
Basophils Absolute: 0 10*3/uL (ref 0.0–0.2)
Basos: 0 %
EOS (ABSOLUTE): 0.3 10*3/uL (ref 0.0–0.4)
Eos: 5 %
Hematocrit: 43.8 % (ref 37.5–51.0)
Hemoglobin: 15.2 g/dL (ref 13.0–17.7)
Immature Grans (Abs): 0 10*3/uL (ref 0.0–0.1)
Immature Granulocytes: 0 %
Lymphocytes Absolute: 1.9 10*3/uL (ref 0.7–3.1)
Lymphs: 29 %
MCH: 28.6 pg (ref 26.6–33.0)
MCHC: 34.7 g/dL (ref 31.5–35.7)
MCV: 82 fL (ref 79–97)
Monocytes Absolute: 0.5 10*3/uL (ref 0.1–0.9)
Monocytes: 7 %
Neutrophils Absolute: 3.8 10*3/uL (ref 1.4–7.0)
Neutrophils: 59 %
Platelets: 264 10*3/uL (ref 150–450)
RBC: 5.32 x10E6/uL (ref 4.14–5.80)
RDW: 12.7 % (ref 11.6–15.4)
WBC: 6.5 10*3/uL (ref 3.4–10.8)

## 2020-05-14 LAB — LIPID PANEL W/O CHOL/HDL RATIO
Cholesterol, Total: 221 mg/dL — ABNORMAL HIGH (ref 100–199)
HDL: 47 mg/dL (ref >39)
LDL Chol Calc (NIH): 152 mg/dL — ABNORMAL HIGH (ref 0–99)
Triglycerides: 124 mg/dL (ref 0–149)
VLDL Cholesterol Cal: 22 mg/dL (ref 5–40)

## 2020-05-14 LAB — TSH: TSH: 0.567 u[IU]/mL (ref 0.450–4.500)

## 2020-05-14 LAB — HEPATITIS C ANTIBODY: Hep C Virus Ab: 0.2 s/co ratio (ref 0.0–0.9)

## 2020-07-13 ENCOUNTER — Other Ambulatory Visit: Payer: Self-pay | Admitting: Family Medicine

## 2020-07-15 ENCOUNTER — Telehealth: Payer: Self-pay | Admitting: Family Medicine

## 2020-07-15 NOTE — Telephone Encounter (Signed)
Patient is calling because he is having extreme anxiety - Paitent wanting to speak to Dr. Laural Benes sooner than 07/22/20. Patient is having extreme anxiety and is taking sertraline (ZOLOFT) 100 MG tablet [[id number]] , & PRN LORazepam (ATIVAN) 0.5 MG tablet [[id number]] - and patient is taking the lorazepem more often. Patient is wanting to know should his sertaline be increased. There is no sooner appt than 07/22/20. Patient is having extreme anxiety. And is requesting if Dr. Laural Benes can write him out of work until he can be seen on 07/22/20. Patient works for [employer].  Cb- [phone]

## 2020-07-15 NOTE — Telephone Encounter (Signed)
Patient last seen 05/13/20 and has an appointment 11/11/20

## 2020-07-15 NOTE — Telephone Encounter (Signed)
FYI pt  scheduled for 07/16/2020

## 2020-07-15 NOTE — Telephone Encounter (Signed)
Called and left a detailed message for patient.  

## 2020-07-15 NOTE — Telephone Encounter (Signed)
FYI, appt tomorrow 

## 2020-07-15 NOTE — Telephone Encounter (Signed)
Will discuss at appt tomorrow. Out of work until 07/17/20- letter provided.

## 2020-07-16 ENCOUNTER — Encounter: Payer: Self-pay | Admitting: Family Medicine

## 2020-07-16 ENCOUNTER — Other Ambulatory Visit: Payer: Self-pay

## 2020-07-16 ENCOUNTER — Ambulatory Visit: Payer: PRIVATE HEALTH INSURANCE | Admitting: Family Medicine

## 2020-07-16 DIAGNOSIS — F411 Generalized anxiety disorder: Secondary | ICD-10-CM

## 2020-07-16 MED ORDER — LORAZEPAM 0.5 MG PO TABS: 0.5000 mg | ORAL_TABLET | Freq: Two times a day (BID) | ORAL | 1 refills | Status: DC | PRN

## 2020-07-16 MED ORDER — SERTRALINE HCL 100 MG PO TABS
200.0000 mg | ORAL_TABLET | Freq: Every day | ORAL | 1 refills | Status: DC
Start: 2020-07-16 — End: 2020-11-01

## 2020-07-16 NOTE — Assessment & Plan Note (Signed)
In acute exacerbation due to issues at work. Will increase his sertraline and lorazepam for now. Out of work until meds kick in- 4 weeks right now. Continue to monitor. Follow up 3-4 weeks.

## 2020-07-16 NOTE — Progress Notes (Signed)
BP (!) 155/95   Pulse 87   Temp 98.2 F (36.8 C)   Wt 193 lb 9.6 oz (87.8 kg)   SpO2 96%   BMI 24.40 kg/m    Subjective:    Patient ID: Shawn Andrade, male    DOB: 26-Feb-1976, 45 y.o.   MRN: [id number]  HPI: SADIK PIASCIK is a 45 y.o. male  Chief Complaint  Patient presents with  . Anxiety    Patient states he has a lot of anxiety lately at work would like to discuss medication options   . Depression   ANXIETY/DEPRESSION Duration: chronic Status:exacerbated Anxious mood: yes  Excessive worrying: yes Irritability: yes  Sweating: no Nausea: no Palpitations:no Hyperventilation: no Panic attacks: yes Agoraphobia: no  Obscessions/compulsions: yes Depressed mood: yes Depression screen [facility] 2/9 07/16/2020 05/13/2020 05/12/2019 10/11/2018 02/10/2018  Decreased Interest 0 0 0 0 0  Down, Depressed, Hopeless 0 0 0 0 0  PHQ - 2 Score 0 0 0 0 0  Altered sleeping 1 0 0 1 0  Tired, decreased energy 1 1 0 1 1  Change in appetite 0 0 0 0 0  Feeling bad or failure about yourself  0 0 0 0 0  Trouble concentrating 1 0 0 0 0  Moving slowly or fidgety/restless 1 0 0 0 0  Suicidal thoughts 0 0 0 0 0  PHQ-9 Score 4 1 0 2 1  Difficult doing work/chores Somewhat difficult Not difficult at all - Not difficult at all Not difficult at all   GAD 7 : Generalized Anxiety Score 07/16/2020 05/12/2019 10/11/2018 02/10/2018  Nervous, Anxious, on Edge 2 0 0 1  Control/stop worrying 2 0 0 1  Worry too much - different things 2 0 0 1  Trouble relaxing 1 0 0 0  Restless 0 0 0 0  Easily annoyed or irritable 1 0 0 0  Afraid - awful might happen 2 0 0 0  Total GAD 7 Score 10 0 0 3  Anxiety Difficulty Somewhat difficult - Not difficult at all Not difficult at all   Anhedonia: no Weight changes: no Insomnia: yes   Hypersomnia: no Fatigue/loss of energy: yes Feelings of worthlessness: no Feelings of guilt: yes Impaired concentration/indecisiveness: no Suicidal ideations: no  Crying spells: no Recent  Stressors/Life Changes: yes   Relationship problems: no   Family stress: no     Financial stress: no    Job stress: yes    Recent death/loss: no  Relevant past medical, surgical, family and social history reviewed and updated as indicated. Interim medical history since our last visit reviewed. Allergies and medications reviewed and updated.  Review of Systems  Constitutional: Negative.   Respiratory: Negative.   Cardiovascular: Negative.   Gastrointestinal: Negative.   Musculoskeletal: Negative.   Psychiatric/Behavioral: Positive for agitation, dysphoric mood, sleep disturbance and suicidal ideas. Negative for behavioral problems, confusion, decreased concentration, hallucinations and self-injury. The patient is nervous/anxious. The patient is not hyperactive.     Per HPI unless specifically indicated above     Objective:    BP (!) 155/95   Pulse 87   Temp 98.2 F (36.8 C)   Wt 193 lb 9.6 oz (87.8 kg)   SpO2 96%   BMI 24.40 kg/m   Wt Readings from Last 3 Encounters:  07/16/20 193 lb 9.6 oz (87.8 kg)  05/13/20 205 lb 9.6 oz (93.3 kg)  05/12/19 216 lb (98 kg)    Physical Exam Vitals and nursing note reviewed.  Constitutional:      General: He is not in acute distress.    Appearance: Normal appearance. He is not ill-appearing, toxic-appearing or diaphoretic.  HENT:     Head: Normocephalic and atraumatic.     Right Ear: External ear normal.     Left Ear: External ear normal.     Nose: Nose normal.     Mouth/Throat:     Mouth: Mucous membranes are moist.     Pharynx: Oropharynx is clear.  Eyes:     General: No scleral icterus.       Right eye: No discharge.        Left eye: No discharge.     Extraocular Movements: Extraocular movements intact.     Conjunctiva/sclera: Conjunctivae normal.     Pupils: Pupils are equal, round, and reactive to light.  Cardiovascular:     Rate and Rhythm: Normal rate and regular rhythm.     Pulses: Normal pulses.     Heart sounds:  Normal heart sounds. No murmur heard. No friction rub. No gallop.   Pulmonary:     Effort: Pulmonary effort is normal. No respiratory distress.     Breath sounds: Normal breath sounds. No stridor. No wheezing, rhonchi or rales.  Chest:     Chest wall: No tenderness.  Musculoskeletal:        General: Normal range of motion.     Cervical back: Normal range of motion and neck supple.  Skin:    General: Skin is warm and dry.     Capillary Refill: Capillary refill takes less than 2 seconds.     Coloration: Skin is not jaundiced or pale.     Findings: No bruising, erythema, lesion or rash.  Neurological:     General: No focal deficit present.     Mental Status: He is alert and oriented to person, place, and time. Mental status is at baseline.  Psychiatric:        Mood and Affect: Mood normal.        Behavior: Behavior normal.        Thought Content: Thought content normal.        Judgment: Judgment normal.     Results for orders placed or performed in visit on 05/13/20  CBC with Differential/Platelet  Result Value Ref Range   WBC 6.5 3.4 - 10.8 x10E3/uL   RBC 5.32 4.14 - 5.80 x10E6/uL   Hemoglobin 15.2 13.0 - 17.7 g/dL   Hematocrit 16.1 09.6 - 51.0 %   MCV 82 79 - 97 fL   MCH 28.6 26.6 - 33.0 pg   MCHC 34.7 31.5 - 35.7 g/dL   RDW 04.5 40.9 - 81.1 %   Platelets 264 150 - 450 x10E3/uL   Neutrophils 59 Not Estab. %   Lymphs 29 Not Estab. %   Monocytes 7 Not Estab. %   Eos 5 Not Estab. %   Basos 0 Not Estab. %   Neutrophils Absolute 3.8 1.4 - 7.0 x10E3/uL   Lymphocytes Absolute 1.9 0.7 - 3.1 x10E3/uL   Monocytes Absolute 0.5 0.1 - 0.9 x10E3/uL   EOS (ABSOLUTE) 0.3 0.0 - 0.4 x10E3/uL   Basophils Absolute 0.0 0.0 - 0.2 x10E3/uL   Immature Granulocytes 0 Not Estab. %   Immature Grans (Abs) 0.0 0.0 - 0.1 x10E3/uL  Comprehensive metabolic panel  Result Value Ref Range   Glucose 98 65 - 99 mg/dL   BUN 15 6 - 24 mg/dL   Creatinine, Ser 9.14 0.76 - 1.27  mg/dL   GFR calc non Af  Amer 106 >59 mL/min/1.73   GFR calc Af Amer 123 >59 mL/min/1.73   BUN/Creatinine Ratio 18 9 - 20   Sodium 140 134 - 144 mmol/L   Potassium 4.1 3.5 - 5.2 mmol/L   Chloride 101 96 - 106 mmol/L   CO2 22 20 - 29 mmol/L   Calcium 9.7 8.7 - 10.2 mg/dL   Total Protein 7.3 6.0 - 8.5 g/dL   Albumin 4.6 4.0 - 5.0 g/dL   Globulin, Total 2.7 1.5 - 4.5 g/dL   Albumin/Globulin Ratio 1.7 1.2 - 2.2   Bilirubin Total 0.4 0.0 - 1.2 mg/dL   Alkaline Phosphatase 80 44 - 121 IU/L   AST 16 0 - 40 IU/L   ALT 17 0 - 44 IU/L  Lipid Panel w/o Chol/HDL Ratio  Result Value Ref Range   Cholesterol, Total 221 (H) 100 - 199 mg/dL   Triglycerides 831 0 - 149 mg/dL   HDL 47 >51 mg/dL   VLDL Cholesterol Cal 22 5 - 40 mg/dL   LDL Chol Calc (NIH) 761 (H) 0 - 99 mg/dL  TSH  Result Value Ref Range   TSH 0.567 0.450 - 4.500 uIU/mL  Urinalysis, Routine w reflex microscopic  Result Value Ref Range   Specific Gravity, UA 1.015 1.005 - 1.030   pH, UA 5.5 5.0 - 7.5   Color, UA Yellow Yellow   Appearance Ur Clear Clear   Leukocytes,UA Negative Negative   Protein,UA Negative Negative/Trace   Glucose, UA Negative Negative   Ketones, UA Negative Negative   RBC, UA Negative Negative   Bilirubin, UA Negative Negative   Urobilinogen, Ur 1.0 0.2 - 1.0 mg/dL   Nitrite, UA Negative Negative  Hepatitis C Antibody  Result Value Ref Range   Hep C Virus Ab 0.2 0.0 - 0.9 s/co ratio      Assessment & Plan:   Problem List Items Addressed This Visit      Other   Anxiety disorder    In acute exacerbation due to issues at work. Will increase his sertraline and lorazepam for now. Out of work until meds kick in- 4 weeks right now. Continue to monitor. Follow up 3-4 weeks.       Relevant Medications   sertraline (ZOLOFT) 100 MG tablet   LORazepam (ATIVAN) 0.5 MG tablet       Follow up plan: Return in about 4 weeks (around 08/13/2020).

## 2020-07-17 ENCOUNTER — Telehealth: Payer: Self-pay

## 2020-07-17 NOTE — Telephone Encounter (Signed)
Paperwork placed in your folder for signature

## 2020-07-17 NOTE — Telephone Encounter (Signed)
Pt has apt on 07/16/2020 brought forms for FMLA to be singed and reviewed.Form left in bin to be reviewed. Pt stated he would like a call when forms are ready to be picked up [phone]

## 2020-07-22 ENCOUNTER — Ambulatory Visit: Payer: PRIVATE HEALTH INSURANCE | Admitting: Family Medicine

## 2020-07-25 ENCOUNTER — Telehealth: Payer: Self-pay | Admitting: Family Medicine

## 2020-07-25 NOTE — Telephone Encounter (Signed)
Per telephone encounter paper is in folder to sign. Please advise

## 2020-07-25 NOTE — Telephone Encounter (Signed)
Pt is calling because he is calling to check on the status of FMLA paperwork that was dropped. Patient states that his HR director called him to check on the status. Please advise CB- [phone]

## 2020-08-05 ENCOUNTER — Encounter: Payer: Self-pay | Admitting: Family Medicine

## 2020-08-13 ENCOUNTER — Other Ambulatory Visit: Payer: Self-pay

## 2020-08-13 ENCOUNTER — Encounter: Payer: Self-pay | Admitting: Family Medicine

## 2020-08-13 ENCOUNTER — Ambulatory Visit: Payer: PRIVATE HEALTH INSURANCE | Admitting: Family Medicine

## 2020-08-13 VITALS — BP 140/84 | HR 85 | Temp 97.9°F | Wt 193.0 lb

## 2020-08-13 DIAGNOSIS — F411 Generalized anxiety disorder: Secondary | ICD-10-CM | POA: Diagnosis not present

## 2020-08-13 NOTE — Assessment & Plan Note (Addendum)
Doing much better. Cleared to return to work. Continue current regimen. Refills up to date. Call with any concerns.

## 2020-08-13 NOTE — Progress Notes (Signed)
BP 140/84    Pulse 85    Temp 97.9 F (36.6 C)    Wt 193 lb (87.5 kg)    SpO2 94%    BMI 24.33 kg/m    Subjective:    Patient ID: Shawn Andrade, male    DOB: Aug 20, 1975, 45 y.o.   MRN: [id number]  HPI: Shawn Andrade is a 45 y.o. male  Chief Complaint  Patient presents with   Anxiety   ANXIETY/STRESS Duration: chronic, much worse in the past couple of months Status:better Anxious mood: yes  Excessive worrying: yes Irritability: no  Sweating: no Nausea: no Palpitations:no Hyperventilation: no Panic attacks: no Agoraphobia: no  Obscessions/compulsions: no Depressed mood: yes Depression screen [facility] 2/9 08/13/2020 07/16/2020 05/13/2020 05/12/2019 10/11/2018  Decreased Interest 0 0 0 0 0  Down, Depressed, Hopeless 0 0 0 0 0  PHQ - 2 Score 0 0 0 0 0  Altered sleeping 2 1 0 0 1  Tired, decreased energy 2 1 1  0 1  Change in appetite 0 0 0 0 0  Feeling bad or failure about yourself  0 0 0 0 0  Trouble concentrating 0 1 0 0 0  Moving slowly or fidgety/restless 0 1 0 0 0  Suicidal thoughts 0 0 0 0 0  PHQ-9 Score 4 4 1  0 2  Difficult doing work/chores Not difficult at all Somewhat difficult Not difficult at all - Not difficult at all   Anhedonia: no Weight changes: no Insomnia: no   Hypersomnia: no Fatigue/loss of energy: yes Feelings of worthlessness: no Feelings of guilt: no Impaired concentration/indecisiveness: no Suicidal ideations: no  Crying spells: no Recent Stressors/Life Changes: yes   Relationship problems: no   Family stress: no     Financial stress: yes    Job stress: yes    Recent death/loss: no  Relevant past medical, surgical, family and social history reviewed and updated as indicated. Interim medical history since our last visit reviewed. Allergies and medications reviewed and updated.  Review of Systems  Constitutional: Negative.   Respiratory: Negative.   Cardiovascular: Negative.   Gastrointestinal: Negative.   Musculoskeletal: Negative.    Psychiatric/Behavioral: Positive for dysphoric mood. Negative for agitation, behavioral problems, confusion, decreased concentration, hallucinations, self-injury, sleep disturbance and suicidal ideas. The patient is nervous/anxious. The patient is not hyperactive.     Per HPI unless specifically indicated above     Objective:    BP 140/84    Pulse 85    Temp 97.9 F (36.6 C)    Wt 193 lb (87.5 kg)    SpO2 94%    BMI 24.33 kg/m   Wt Readings from Last 3 Encounters:  08/13/20 193 lb (87.5 kg)  07/16/20 193 lb 9.6 oz (87.8 kg)  05/13/20 205 lb 9.6 oz (93.3 kg)    Physical Exam Vitals and nursing note reviewed.  Constitutional:      General: He is not in acute distress.    Appearance: Normal appearance. He is not ill-appearing, toxic-appearing or diaphoretic.  HENT:     Head: Normocephalic and atraumatic.     Right Ear: External ear normal.     Left Ear: External ear normal.     Nose: Nose normal.     Mouth/Throat:     Mouth: Mucous membranes are moist.     Pharynx: Oropharynx is clear.  Eyes:     General: No scleral icterus.       Right eye: No discharge.  Left eye: No discharge.     Extraocular Movements: Extraocular movements intact.     Conjunctiva/sclera: Conjunctivae normal.     Pupils: Pupils are equal, round, and reactive to light.  Cardiovascular:     Rate and Rhythm: Normal rate and regular rhythm.     Pulses: Normal pulses.     Heart sounds: Normal heart sounds. No murmur heard. No friction rub. No gallop.   Pulmonary:     Effort: Pulmonary effort is normal. No respiratory distress.     Breath sounds: Normal breath sounds. No stridor. No wheezing, rhonchi or rales.  Chest:     Chest wall: No tenderness.  Musculoskeletal:        General: Normal range of motion.     Cervical back: Normal range of motion and neck supple.  Skin:    General: Skin is warm and dry.     Capillary Refill: Capillary refill takes less than 2 seconds.     Coloration: Skin is not  jaundiced or pale.     Findings: No bruising, erythema, lesion or rash.  Neurological:     General: No focal deficit present.     Mental Status: He is alert and oriented to person, place, and time. Mental status is at baseline.  Psychiatric:        Mood and Affect: Mood is anxious.        Behavior: Behavior normal.        Thought Content: Thought content normal.        Judgment: Judgment normal.     Results for orders placed or performed in visit on 05/13/20  CBC with Differential/Platelet  Result Value Ref Range   WBC 6.5 3.4 - 10.8 x10E3/uL   RBC 5.32 4.14 - 5.80 x10E6/uL   Hemoglobin 15.2 13.0 - 17.7 g/dL   Hematocrit 10.9 32.3 - 51.0 %   MCV 82 79 - 97 fL   MCH 28.6 26.6 - 33.0 pg   MCHC 34.7 31.5 - 35.7 g/dL   RDW 55.7 32.2 - 02.5 %   Platelets 264 150 - 450 x10E3/uL   Neutrophils 59 Not Estab. %   Lymphs 29 Not Estab. %   Monocytes 7 Not Estab. %   Eos 5 Not Estab. %   Basos 0 Not Estab. %   Neutrophils Absolute 3.8 1.4 - 7.0 x10E3/uL   Lymphocytes Absolute 1.9 0.7 - 3.1 x10E3/uL   Monocytes Absolute 0.5 0.1 - 0.9 x10E3/uL   EOS (ABSOLUTE) 0.3 0.0 - 0.4 x10E3/uL   Basophils Absolute 0.0 0.0 - 0.2 x10E3/uL   Immature Granulocytes 0 Not Estab. %   Immature Grans (Abs) 0.0 0.0 - 0.1 x10E3/uL  Comprehensive metabolic panel  Result Value Ref Range   Glucose 98 65 - 99 mg/dL   BUN 15 6 - 24 mg/dL   Creatinine, Ser 4.27 0.76 - 1.27 mg/dL   GFR calc non Af Amer 106 >59 mL/min/1.73   GFR calc Af Amer 123 >59 mL/min/1.73   BUN/Creatinine Ratio 18 9 - 20   Sodium 140 134 - 144 mmol/L   Potassium 4.1 3.5 - 5.2 mmol/L   Chloride 101 96 - 106 mmol/L   CO2 22 20 - 29 mmol/L   Calcium 9.7 8.7 - 10.2 mg/dL   Total Protein 7.3 6.0 - 8.5 g/dL   Albumin 4.6 4.0 - 5.0 g/dL   Globulin, Total 2.7 1.5 - 4.5 g/dL   Albumin/Globulin Ratio 1.7 1.2 - 2.2   Bilirubin Total 0.4 0.0 -  1.2 mg/dL   Alkaline Phosphatase 80 44 - 121 IU/L   AST 16 0 - 40 IU/L   ALT 17 0 - 44 IU/L  Lipid  Panel w/o Chol/HDL Ratio  Result Value Ref Range   Cholesterol, Total 221 (H) 100 - 199 mg/dL   Triglycerides 440 0 - 149 mg/dL   HDL 47 >10 mg/dL   VLDL Cholesterol Cal 22 5 - 40 mg/dL   LDL Chol Calc (NIH) 272 (H) 0 - 99 mg/dL  TSH  Result Value Ref Range   TSH 0.567 0.450 - 4.500 uIU/mL  Urinalysis, Routine w reflex microscopic  Result Value Ref Range   Specific Gravity, UA 1.015 1.005 - 1.030   pH, UA 5.5 5.0 - 7.5   Color, UA Yellow Yellow   Appearance Ur Clear Clear   Leukocytes,UA Negative Negative   Protein,UA Negative Negative/Trace   Glucose, UA Negative Negative   Ketones, UA Negative Negative   RBC, UA Negative Negative   Bilirubin, UA Negative Negative   Urobilinogen, Ur 1.0 0.2 - 1.0 mg/dL   Nitrite, UA Negative Negative  Hepatitis C Antibody  Result Value Ref Range   Hep C Virus Ab 0.2 0.0 - 0.9 s/co ratio      Assessment & Plan:   Problem List Items Addressed This Visit      Other   Anxiety disorder - Primary    Doing much better. Cleared to return to work. Continue current regimen. Call with any concerns.           Follow up plan: Return in about 6 months (around 02/13/2021).   >15 minutes spent with patient today.

## 2020-08-23 ENCOUNTER — Telehealth: Payer: Self-pay

## 2020-08-23 NOTE — Telephone Encounter (Signed)
Called amanda advised that we did receive request [facility] hasn't picked up yet   Copied from CRM [id number]. Topic: Medical Record Request - Provider/Facility Request >> Aug 23, 2020 11:23 AM Crist Infante wrote: Patient Name/DOB/MRN #: Shawn Andrade  05/07/1976  MRN:  [id number] Requestor Name/Agency: [employer] Call Back #: [phone]  ext [phone]  [facility] Information Requested: 06/08/2005 to present  Anything we can share, as pt is waiting on approval to go back to work.  Fitness of mind eval  Please send asap. Please call amanda if you did not receive signed and faxed release .   Route to [employer] for [employer]. For all other clinics, route to the clinic's PEC Pool.

## 2020-08-28 ENCOUNTER — Telehealth: Payer: Self-pay

## 2020-08-28 NOTE — Telephone Encounter (Signed)
Copied from CRM [id number]. Topic: Medical Record Request - Provider/Facility Request >> Aug 23, 2020 11:23 AM Crist Infante wrote: Patient Name/DOB/MRN #: Shawn Andrade  Dec 01, 1975  MRN:  [id number] Requestor Name/Agency: [employer] Call Back #: [phone]  ext [phone]  [facility] Information Requested: 06/08/2005 to present  Anything we can share, as pt is waiting on approval to go back to work.  Fitness of mind eval  Please send asap. Please call amanda if you did not receive signed and faxed release .   Route to [employer] for [employer]. For all other clinics, route to the clinic's PEC Pool. 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

## 2020-08-28 NOTE — Telephone Encounter (Signed)
Routing to provider  

## 2020-08-28 NOTE — Telephone Encounter (Signed)
Copied from CRM #[id number]. Topic: Medical Record Request - Provider/Facility Request >> Aug 23, 2020 11:23 AM Harrald, Kathy J wrote: Patient Name/DOB/MRN #: Jayvien L. Matters  07/31/1975  MRN:  [id number] Requestor Name/Agency: [employer] Call Back #: [phone]  ext [phone]  [facility] Information Requested: 06/08/2005 to present  Anything we can share, as pt is waiting on approval to go back to work.  Fitness of mind eval  Please send asap. Please call amanda if you did not receive signed and faxed release .   Route to [facility] HIM Pool for [place] [facility]. For all other clinics, route to the clinic's PEC Pool. 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  

## 2020-08-29 NOTE — Telephone Encounter (Signed)
This is a records release request. I'm not sure what they want

## 2020-08-29 NOTE — Telephone Encounter (Signed)
Called amanda left detailed message that we have alerted [facility] to get these records to her asap

## 2020-08-29 NOTE — Telephone Encounter (Signed)
Records will be sent through [facility]

## 2020-09-02 NOTE — Telephone Encounter (Signed)
This is double documented erroneous error.

## 2020-10-29 ENCOUNTER — Encounter: Payer: Self-pay | Admitting: Family Medicine

## 2020-10-29 ENCOUNTER — Other Ambulatory Visit: Payer: Self-pay

## 2020-10-29 ENCOUNTER — Ambulatory Visit: Payer: PRIVATE HEALTH INSURANCE | Admitting: Family Medicine

## 2020-10-29 VITALS — BP 157/92 | HR 88 | Temp 98.2°F | Wt 193.0 lb

## 2020-10-29 DIAGNOSIS — H9202 Otalgia, left ear: Secondary | ICD-10-CM

## 2020-10-29 DIAGNOSIS — M26609 Unspecified temporomandibular joint disorder, unspecified side: Secondary | ICD-10-CM | POA: Diagnosis not present

## 2020-10-29 MED ORDER — CYCLOBENZAPRINE HCL 10 MG PO TABS: 10.0000 mg | ORAL_TABLET | Freq: Every evening | ORAL | 0 refills | Status: DC | PRN

## 2020-10-29 NOTE — Patient Instructions (Addendum)
Temporomandibular Joint Syndrome  Temporomandibular joint syndrome (TMJ syndrome) is a condition that causes pain in the temporomandibular joints. These joints are located near your ears and allow your jaw to open and close. For people with TMJ syndrome, chewing, biting, or other movements of the jaw can be difficult or painful. TMJ syndrome is often mild and goes away within a few weeks. However, sometimes the condition becomes a long-term (chronic) problem. What are the causes? This condition may be caused by:  Grinding your teeth or clenching your jaw. Some people do this when they are under stress.  Arthritis.  Injury to the jaw.  Head or neck injury.  Teeth or dentures that are not aligned well. In some cases, the cause of TMJ syndrome may not be known. What are the signs or symptoms? The most common symptom of this condition is an aching pain on the side of the head in the area of the TMJ. Other symptoms may include:  Pain when moving your jaw, such as when chewing or biting.  Being unable to open your jaw all the way.  Making a clicking sound when you open your mouth.  Headache.  Earache.  Neck or shoulder pain. How is this diagnosed? This condition may be diagnosed based on:  Your symptoms and medical history.  A physical exam. Your health care provider may check the range of motion of your jaw.  Imaging tests, such as X-rays or an MRI. You may also need to see your dentist, who will determine if your teeth and jaw are lined up correctly. How is this treated? TMJ syndrome often goes away on its own. If treatment is needed, the options may include:  Eating soft foods and applying ice or heat.  Medicines to relieve pain or inflammation.  Medicines or massage to relax the muscles.  A splint, bite plate, or mouthpiece to prevent teeth grinding or jaw clenching.  Relaxation techniques or counseling to help reduce stress.  A therapy for pain in which an  electrical current is applied to the nerves through the skin (transcutaneous electrical nerve stimulation).  Acupuncture. This is sometimes helpful to relieve pain.  Jaw surgery. This is rarely needed. Follow these instructions at home: Eating and drinking  Eat a soft diet if you are having trouble chewing.  Avoid foods that require a lot of chewing. Do not chew gum. General instructions  Take over-the-counter and prescription medicines only as told by your health care provider.  If directed, put ice on the painful area. ? Put ice in a plastic bag. ? Place a towel between your skin and the bag. ? Leave the ice on for 20 minutes, 2-3 times a day.  Apply a warm, wet cloth (warm compress) to the painful area as directed.  Massage your jaw area and do any jaw stretching exercises as told by your health care provider.  If you were given a splint, bite plate, or mouthpiece, wear it as told by your health care provider.  Keep all follow-up visits as told by your health care provider. This is important.   Contact a health care provider if:  You are having trouble eating.  You have new or worsening symptoms. Get help right away if:  Your jaw locks open or closed. Summary  Temporomandibular joint syndrome (TMJ syndrome) is a condition that causes pain in the temporomandibular joints. These joints are located near your ears and allow your jaw to open and close.  TMJ syndrome is often mild   and goes away within a few weeks. However, sometimes the condition becomes a long-term (chronic) problem.  Symptoms include an aching pain on the side of the head in the area of the TMJ, pain when chewing or biting, and being unable to open your jaw all the way. You may also make a clicking sound when you open your mouth.  TMJ syndrome often goes away on its own. If treatment is needed, it may include medicines to relieve pain, reduce inflammation, or relax the muscles. A splint, bite plate, or  mouthpiece may also be used to prevent teeth grinding or jaw clenching. This information is not intended to replace advice given to you by your health care provider. Make sure you discuss any questions you have with your health care provider. Document Revised: 08/06/2017 Document Reviewed: 07/06/2017 Elsevier Patient Education  2021 [employer].  Oral and maxillofacial surgery (3rd ed.). Elsevier. Retrieved from https://www.clinicalkey.com">  Jaw Range of Motion Exercises Jaw range of motion exercises are exercises that help your jaw move better. Exercises that help you have good posture (postural exercises) also help relieve jaw discomfort. These are often done along with range of motion exercises. These exercises can help prevent or improve:  Difficulty opening your mouth.  Pain in your jaw while it is open or closed.  Temporomandibular joint (TMJ) pain.  Headache caused by jaw tension. Take other actions to prevent or relieve jaw pain, such as:  Avoiding things that cause or increase jaw pain. This may include: ? Chewing gum or eating hard foods. ? Clenching your jaw or teeth, grinding your teeth, or keeping tension in your jaw muscles. ? Opening your mouth wide, such as for a big yawn. ? Leaning on your jaw, such as resting your jaw in your hand while leaning on a desk.  Putting ice on your jaw. ? Put ice in a plastic bag. ? Place a towel between your skin and the bag. ? Leave the ice on for 10-15 minutes, 2-3 times a day. Only do jaw exercises that your health care provider approves of. Only move your jaw as far as it can comfortably go in each direction. Do not move your jaw into positions that cause pain. Range of motion exercises Repeat each of these exercises 8 times, 1-2 times a day, or as told by your health care provider. Exercise A: Forward protrusion 1. Push your jaw forward. Hold this position for 1-2 seconds. 2. Allow your jaw to return to its normal position and  rest it there for 1-2 seconds. Exercise B: Controlled opening 1. Stand or sit in front of a mirror. Place your tongue on the roof of your mouth, just behind your top teeth. 2. Keeping your tongue on the roof of your mouth, slowly open and close your mouth. 3. While you open and close your mouth, watch your jaw in the mirror. Try to keep your jaw from moving to one side or the other. Exercise C: Right and left motion 1. Move your jaw right. Hold this position for 1-2 seconds. Allow your jaw to return to its normal position, and rest it there for 1-2 seconds. 2. Move your jaw left. Hold this position for 1-2 seconds. Allow your jaw to return to its normal position, and rest it there for 1-2 seconds. Postural exercises Exercise A: Chin tucks 1. You can do this exercise sitting, standing, or lying down. 2. Move your head straight back, keeping your head level. You can guide the movement by placing  your fingers on your chin to push your jaw back in an even motion. You should be able to feel a double chin form at the end of the motion. 3. Hold this position for 5 seconds. Repeat 10-15 times. Exercise B: Shoulder blade squeeze 1. Sit or stand. 2. Bend your elbows to about 90 degrees, which is the shape of a capital letter "L." Keep your upper arms by your body. 3. Squeeze your shoulder blades down and back, as though you were trying to touch your elbows behind you. Do not shrug your shoulders or move your head. 4. Hold this position for 5 seconds. Repeat 10-15 times. Exercise C: Chest stretch 1. Stand facing a corner. 2. Put both of your hands and your forearms on the wall, with your arms wide apart. 3. Make sure your arms are at a 90-degree angle to your body. This means that you should hold your arms straight out from your body, level with the floor. 4. Step in toward the corner. Do not lean in. 5. Hold this position for 30 seconds. Repeat 3 times. Contact a health care provider if you  have:  Jaw pain that is new or gets worse.  Clicking or popping sounds while doing the exercises. Get help right away if:  Your jaw is stuck in one place and you cannot move it.  You cannot open or close your mouth. Summary  Jaw range of motion exercises are exercises that help your jaw move better.  Take actions to prevent or relieve jaw pain: limit chewing gum or eating hard foods; clenching your jaw or teeth; or leaning on your jaw, such as resting your jaw in your hand while leaning on a desk.  Repeat each of the jaw range of motion exercises 8 times, 1-2 times a day, or as told by your health care provider.  Contact a health care provider if you have clicking or popping sounds while doing the exercises. This information is not intended to replace advice given to you by your health care provider. Make sure you discuss any questions you have with your health care provider. Document Revised: 02/16/2020 Document Reviewed: 02/16/2020 Elsevier Patient Education  2021 [employer].

## 2020-10-29 NOTE — Progress Notes (Signed)
BP (!) 157/92   Pulse 88   Temp 98.2 F (36.8 C)   Wt 193 lb (87.5 kg)   SpO2 97%   BMI 24.33 kg/m    Subjective:    Patient ID: Shawn Andrade, male    DOB: [birth year], 45 y.o.   MRN: [id number]  HPI: Shawn Andrade is a 45 y.o. male  Chief Complaint  Patient presents with  . Ear Pain    Patient states he has been having left ear pain. Patient states he feels like there is pressure in his ear.    EAR PAIN Duration: 3-4 days Involved ear(s): left Severity:  moderate  Quality:  sore Fever: no Otorrhea: no Upper respiratory infection symptoms: no Pruritus: no Hearing loss: no Water immersion no Using Q-tips: no Recurrent otitis media: no Status: worse Treatments attempted: none  Relevant past medical, surgical, family and social history reviewed and updated as indicated. Interim medical history since our last visit reviewed. Allergies and medications reviewed and updated.  Review of Systems  Constitutional: Negative.   HENT: Positive for ear pain. Negative for congestion, dental problem, drooling, ear discharge, facial swelling, hearing loss, mouth sores, nosebleeds, postnasal drip, rhinorrhea, sinus pressure, sinus pain, sneezing, sore throat, tinnitus, trouble swallowing and voice change.   Respiratory: Negative.   Cardiovascular: Negative.   Gastrointestinal: Negative.   Musculoskeletal: Negative.   Psychiatric/Behavioral: Negative.     Per HPI unless specifically indicated above     Objective:    BP (!) 157/92   Pulse 88   Temp 98.2 F (36.8 C)   Wt 193 lb (87.5 kg)   SpO2 97%   BMI 24.33 kg/m   Wt Readings from Last 3 Encounters:  10/29/20 193 lb (87.5 kg)  08/13/20 193 lb (87.5 kg)  07/16/20 193 lb 9.6 oz (87.8 kg)    Physical Exam Vitals and nursing note reviewed.  Constitutional:      General: He is not in acute distress.    Appearance: Normal appearance. He is not ill-appearing, toxic-appearing or diaphoretic.  HENT:     Head:  Normocephalic and atraumatic.     Right Ear: External ear normal.     Left Ear: External ear normal.     Nose: Nose normal.     Mouth/Throat:     Mouth: Mucous membranes are moist.     Pharynx: Oropharynx is clear.  Eyes:     General: No scleral icterus.       Right eye: No discharge.        Left eye: No discharge.     Extraocular Movements: Extraocular movements intact.     Conjunctiva/sclera: Conjunctivae normal.     Pupils: Pupils are equal, round, and reactive to light.  Cardiovascular:     Rate and Rhythm: Normal rate and regular rhythm.     Pulses: Normal pulses.     Heart sounds: Normal heart sounds. No murmur heard. No friction rub. No gallop.   Pulmonary:     Effort: Pulmonary effort is normal. No respiratory distress.     Breath sounds: Normal breath sounds. No stridor. No wheezing, rhonchi or rales.  Chest:     Chest wall: No tenderness.  Musculoskeletal:        General: Normal range of motion.     Cervical back: Normal range of motion and neck supple.  Skin:    General: Skin is warm and dry.     Capillary Refill: Capillary refill takes less than 2 seconds.  Coloration: Skin is not jaundiced or pale.     Findings: No bruising, erythema, lesion or rash.  Neurological:     General: No focal deficit present.     Mental Status: He is alert and oriented to person, place, and time. Mental status is at baseline.  Psychiatric:        Mood and Affect: Mood normal.        Behavior: Behavior normal.        Thought Content: Thought content normal.        Judgment: Judgment normal.     Results for orders placed or performed in visit on 05/13/20  CBC with Differential/Platelet  Result Value Ref Range   WBC 6.5 3.4 - 10.8 x10E3/uL   RBC 5.32 4.14 - 5.80 x10E6/uL   Hemoglobin 15.2 13.0 - 17.7 g/dL   Hematocrit 02.6 37.8 - 51.0 %   MCV 82 79 - 97 fL   MCH 28.6 26.6 - 33.0 pg   MCHC 34.7 31.5 - 35.7 g/dL   RDW 58.8 50.2 - 77.4 %   Platelets 264 150 - 450 x10E3/uL    Neutrophils 59 Not Estab. %   Lymphs 29 Not Estab. %   Monocytes 7 Not Estab. %   Eos 5 Not Estab. %   Basos 0 Not Estab. %   Neutrophils Absolute 3.8 1.4 - 7.0 x10E3/uL   Lymphocytes Absolute 1.9 0.7 - 3.1 x10E3/uL   Monocytes Absolute 0.5 0.1 - 0.9 x10E3/uL   EOS (ABSOLUTE) 0.3 0.0 - 0.4 x10E3/uL   Basophils Absolute 0.0 0.0 - 0.2 x10E3/uL   Immature Granulocytes 0 Not Estab. %   Immature Grans (Abs) 0.0 0.0 - 0.1 x10E3/uL  Comprehensive metabolic panel  Result Value Ref Range   Glucose 98 65 - 99 mg/dL   BUN 15 6 - 24 mg/dL   Creatinine, Ser 1.28 0.76 - 1.27 mg/dL   GFR calc non Af Amer 106 >59 mL/min/1.73   GFR calc Af Amer 123 >59 mL/min/1.73   BUN/Creatinine Ratio 18 9 - 20   Sodium 140 134 - 144 mmol/L   Potassium 4.1 3.5 - 5.2 mmol/L   Chloride 101 96 - 106 mmol/L   CO2 22 20 - 29 mmol/L   Calcium 9.7 8.7 - 10.2 mg/dL   Total Protein 7.3 6.0 - 8.5 g/dL   Albumin 4.6 4.0 - 5.0 g/dL   Globulin, Total 2.7 1.5 - 4.5 g/dL   Albumin/Globulin Ratio 1.7 1.2 - 2.2   Bilirubin Total 0.4 0.0 - 1.2 mg/dL   Alkaline Phosphatase 80 44 - 121 IU/L   AST 16 0 - 40 IU/L   ALT 17 0 - 44 IU/L  Lipid Panel w/o Chol/HDL Ratio  Result Value Ref Range   Cholesterol, Total 221 (H) 100 - 199 mg/dL   Triglycerides 786 0 - 149 mg/dL   HDL 47 >76 mg/dL   VLDL Cholesterol Cal 22 5 - 40 mg/dL   LDL Chol Calc (NIH) 720 (H) 0 - 99 mg/dL  TSH  Result Value Ref Range   TSH 0.567 0.450 - 4.500 uIU/mL  Urinalysis, Routine w reflex microscopic  Result Value Ref Range   Specific Gravity, UA 1.015 1.005 - 1.030   pH, UA 5.5 5.0 - 7.5   Color, UA Yellow Yellow   Appearance Ur Clear Clear   Leukocytes,UA Negative Negative   Protein,UA Negative Negative/Trace   Glucose, UA Negative Negative   Ketones, UA Negative Negative   RBC, UA Negative Negative  Bilirubin, UA Negative Negative   Urobilinogen, Ur 1.0 0.2 - 1.0 mg/dL   Nitrite, UA Negative Negative  Hepatitis C Antibody  Result Value Ref  Range   Hep C Virus Ab 0.2 0.0 - 0.9 s/co ratio      Assessment & Plan:   Problem List Items Addressed This Visit   None   Visit Diagnoses    TMJ (temporomandibular joint disorder)    -  Primary   Will treat with flexeril and exercises. Call if not gettin gbetter or getting worse.        Follow up plan: Return if symptoms worsen or fail to improve.

## 2020-11-01 ENCOUNTER — Other Ambulatory Visit: Payer: Self-pay | Admitting: Family Medicine

## 2020-11-11 ENCOUNTER — Ambulatory Visit: Payer: PRIVATE HEALTH INSURANCE | Admitting: Family Medicine

## 2021-01-07 ENCOUNTER — Other Ambulatory Visit: Payer: Self-pay | Admitting: Family Medicine

## 2021-01-07 NOTE — Telephone Encounter (Signed)
Requested medications are due for refill today.  yes  Requested medications are on the active medications list.  yes  Last refill. 10/29/2020  Future visit scheduled.   yes  Notes to clinic.  Medication not delegated. 

## 2021-01-08 ENCOUNTER — Encounter: Payer: Self-pay | Admitting: Family Medicine

## 2021-01-08 NOTE — Telephone Encounter (Signed)
Patient last seen 10/29/20 and has follow up in September.

## 2021-01-09 ENCOUNTER — Other Ambulatory Visit: Payer: Self-pay

## 2021-01-09 ENCOUNTER — Ambulatory Visit: Payer: BC Managed Care – PPO | Admitting: Family Medicine

## 2021-01-09 ENCOUNTER — Encounter: Payer: Self-pay | Admitting: Family Medicine

## 2021-01-09 VITALS — BP 122/81 | HR 80 | Temp 98.0°F | Wt 198.4 lb

## 2021-01-09 DIAGNOSIS — F411 Generalized anxiety disorder: Secondary | ICD-10-CM

## 2021-01-09 MED ORDER — SERTRALINE HCL 100 MG PO TABS: 100.0000 mg | ORAL_TABLET | Freq: Every day | ORAL | 1 refills | Status: DC

## 2021-01-09 MED ORDER — ESOMEPRAZOLE MAGNESIUM 40 MG PO CPDR: 40.0000 mg | DELAYED_RELEASE_CAPSULE | Freq: Every day | ORAL | 3 refills | Status: DC

## 2021-01-09 NOTE — Assessment & Plan Note (Signed)
In exacerbation due to issues at work. Will put out of work until 8/15 due to anxiety and harrassment at work. Continue sertraline. Not needing refill on lorazepam right now. Continue to monitor.

## 2021-01-09 NOTE — Progress Notes (Signed)
BP 122/81   Pulse 80   Temp 98 F (36.7 C) (Oral)   Wt 198 lb 6.4 oz (90 kg)   SpO2 98%   BMI 25.01 kg/m    Subjective:    Patient ID: Shawn Andrade, male    DOB: 04-25-1976, 45 y.o.   MRN: [id number]  HPI: Shawn Andrade is a 45 y.o. male  Chief Complaint  Patient presents with   Anxiety    Pt states he has had increased anxiety lately, states he is being harassed at work    ANXIETY/STRESS- has been very anxious as his supervisors have been harassing him. He has had several write ups which he claims are not correct and that he is being personally targeted. This has been significantly worse since he has filed complaints against his department. He states that his supervisor has been taking stabs at him and has been threatening his job and his retirement/pension Duration: chronic- worse in the past couple of weeks Status:exacerbated Anxious mood: yes  Excessive worrying: yes Irritability: yes  Sweating: no Nausea: no Palpitations:no Hyperventilation: no Panic attacks: no Agoraphobia: no  Obscessions/compulsions: no Depressed mood: yes Depression screen [facility] 2/9 01/09/2021 08/13/2020 07/16/2020 05/13/2020 05/12/2019  Decreased Interest 1 0 0 0 0  Down, Depressed, Hopeless 0 0 0 0 0  PHQ - 2 Score 1 0 0 0 0  Altered sleeping 2 2 1  0 0  Tired, decreased energy 3 2 1 1  0  Change in appetite 0 0 0 0 0  Feeling bad or failure about yourself  0 0 0 0 0  Trouble concentrating 0 0 1 0 0  Moving slowly or fidgety/restless 0 0 1 0 0  Suicidal thoughts 0 0 0 0 0  PHQ-9 Score 6 4 4 1  0  Difficult doing work/chores Not difficult at all Not difficult at all Somewhat difficult Not difficult at all -  Some recent data might be hidden   Anhedonia: no Weight changes: no Insomnia: yes hard to fall asleep  Hypersomnia: yes Fatigue/loss of energy: yes Feelings of worthlessness: yes Feelings of guilt: yes Impaired concentration/indecisiveness: yes Suicidal ideations: no  Crying spells:  no Recent Stressors/Life Changes: yes   Relationship problems: no   Family stress: yes     Financial stress: no    Job stress: yes    Recent death/loss: no  Relevant past medical, surgical, family and social history reviewed and updated as indicated. Interim medical history since our last visit reviewed. Allergies and medications reviewed and updated.  Review of Systems  Constitutional: Negative.   Respiratory: Negative.    Cardiovascular: Negative.   Gastrointestinal: Negative.   Musculoskeletal: Negative.   Skin: Negative.   Psychiatric/Behavioral:  Positive for dysphoric mood. Negative for agitation, behavioral problems, confusion, decreased concentration, hallucinations, self-injury, sleep disturbance and suicidal ideas. The patient is nervous/anxious. The patient is not hyperactive.    Per HPI unless specifically indicated above     Objective:    BP 122/81   Pulse 80   Temp 98 F (36.7 C) (Oral)   Wt 198 lb 6.4 oz (90 kg)   SpO2 98%   BMI 25.01 kg/m   Wt Readings from Last 3 Encounters:  01/09/21 198 lb 6.4 oz (90 kg)  10/29/20 193 lb (87.5 kg)  08/13/20 193 lb (87.5 kg)    Physical Exam Vitals and nursing note reviewed.  Constitutional:      General: He is not in acute distress.    Appearance:  Normal appearance. He is not ill-appearing, toxic-appearing or diaphoretic.  HENT:     Head: Normocephalic and atraumatic.     Right Ear: External ear normal.     Left Ear: External ear normal.     Nose: Nose normal.     Mouth/Throat:     Mouth: Mucous membranes are moist.     Pharynx: Oropharynx is clear.  Eyes:     General: No scleral icterus.       Right eye: No discharge.        Left eye: No discharge.     Extraocular Movements: Extraocular movements intact.     Conjunctiva/sclera: Conjunctivae normal.     Pupils: Pupils are equal, round, and reactive to light.  Cardiovascular:     Rate and Rhythm: Normal rate and regular rhythm.     Pulses: Normal pulses.      Heart sounds: Normal heart sounds. No murmur heard.   No friction rub. No gallop.  Pulmonary:     Effort: Pulmonary effort is normal. No respiratory distress.     Breath sounds: Normal breath sounds. No stridor. No wheezing, rhonchi or rales.  Chest:     Chest wall: No tenderness.  Musculoskeletal:        General: Normal range of motion.     Cervical back: Normal range of motion and neck supple.  Skin:    General: Skin is warm and dry.     Capillary Refill: Capillary refill takes less than 2 seconds.     Coloration: Skin is not jaundiced or pale.     Findings: No bruising, erythema, lesion or rash.  Neurological:     General: No focal deficit present.     Mental Status: He is alert and oriented to person, place, and time. Mental status is at baseline.  Psychiatric:        Mood and Affect: Mood is anxious.        Behavior: Behavior normal.        Thought Content: Thought content normal.        Judgment: Judgment normal.    Results for orders placed or performed in visit on 05/13/20  CBC with Differential/Platelet  Result Value Ref Range   WBC 6.5 3.4 - 10.8 x10E3/uL   RBC 5.32 4.14 - 5.80 x10E6/uL   Hemoglobin 15.2 13.0 - 17.7 g/dL   Hematocrit 54.2 70.6 - 51.0 %   MCV 82 79 - 97 fL   MCH 28.6 26.6 - 33.0 pg   MCHC 34.7 31.5 - 35.7 g/dL   RDW 23.7 62.8 - 31.5 %   Platelets 264 150 - 450 x10E3/uL   Neutrophils 59 Not Estab. %   Lymphs 29 Not Estab. %   Monocytes 7 Not Estab. %   Eos 5 Not Estab. %   Basos 0 Not Estab. %   Neutrophils Absolute 3.8 1.4 - 7.0 x10E3/uL   Lymphocytes Absolute 1.9 0.7 - 3.1 x10E3/uL   Monocytes Absolute 0.5 0.1 - 0.9 x10E3/uL   EOS (ABSOLUTE) 0.3 0.0 - 0.4 x10E3/uL   Basophils Absolute 0.0 0.0 - 0.2 x10E3/uL   Immature Granulocytes 0 Not Estab. %   Immature Grans (Abs) 0.0 0.0 - 0.1 x10E3/uL  Comprehensive metabolic panel  Result Value Ref Range   Glucose 98 65 - 99 mg/dL   BUN 15 6 - 24 mg/dL   Creatinine, Ser 1.76 0.76 - 1.27 mg/dL    GFR calc non Af Amer 106 >59 mL/min/1.73   GFR calc  Af Amer 123 >59 mL/min/1.73   BUN/Creatinine Ratio 18 9 - 20   Sodium 140 134 - 144 mmol/L   Potassium 4.1 3.5 - 5.2 mmol/L   Chloride 101 96 - 106 mmol/L   CO2 22 20 - 29 mmol/L   Calcium 9.7 8.7 - 10.2 mg/dL   Total Protein 7.3 6.0 - 8.5 g/dL   Albumin 4.6 4.0 - 5.0 g/dL   Globulin, Total 2.7 1.5 - 4.5 g/dL   Albumin/Globulin Ratio 1.7 1.2 - 2.2   Bilirubin Total 0.4 0.0 - 1.2 mg/dL   Alkaline Phosphatase 80 44 - 121 IU/L   AST 16 0 - 40 IU/L   ALT 17 0 - 44 IU/L  Lipid Panel w/o Chol/HDL Ratio  Result Value Ref Range   Cholesterol, Total 221 (H) 100 - 199 mg/dL   Triglycerides 431 0 - 149 mg/dL   HDL 47 >54 mg/dL   VLDL Cholesterol Cal 22 5 - 40 mg/dL   LDL Chol Calc (NIH) 008 (H) 0 - 99 mg/dL  TSH  Result Value Ref Range   TSH 0.567 0.450 - 4.500 uIU/mL  Urinalysis, Routine w reflex microscopic  Result Value Ref Range   Specific Gravity, UA 1.015 1.005 - 1.030   pH, UA 5.5 5.0 - 7.5   Color, UA Yellow Yellow   Appearance Ur Clear Clear   Leukocytes,UA Negative Negative   Protein,UA Negative Negative/Trace   Glucose, UA Negative Negative   Ketones, UA Negative Negative   RBC, UA Negative Negative   Bilirubin, UA Negative Negative   Urobilinogen, Ur 1.0 0.2 - 1.0 mg/dL   Nitrite, UA Negative Negative  Hepatitis C Antibody  Result Value Ref Range   Hep C Virus Ab 0.2 0.0 - 0.9 s/co ratio      Assessment & Plan:   Problem List Items Addressed This Visit       Other   Anxiety disorder - Primary    In exacerbation due to issues at work. Will put out of work until 8/15 due to anxiety and harrassment at work. Continue sertraline. Not needing refill on lorazepam right now. Continue to monitor.        Relevant Medications   sertraline (ZOLOFT) 100 MG tablet     Follow up plan: Return in about 4 months (around 05/11/2021) for physical.

## 2021-01-16 ENCOUNTER — Encounter: Payer: Self-pay | Admitting: Family Medicine

## 2021-01-17 ENCOUNTER — Encounter: Payer: Self-pay | Admitting: Family Medicine

## 2021-02-07 ENCOUNTER — Ambulatory Visit: Payer: PRIVATE HEALTH INSURANCE | Admitting: Family Medicine

## 2021-03-11 ENCOUNTER — Other Ambulatory Visit: Payer: Self-pay | Admitting: Family Medicine

## 2021-03-11 NOTE — Telephone Encounter (Signed)
Requested medication (s) are due for refill today: yes  Requested medication (s) are on the active medication list: yes  Last refill: 01/08/21  #30   0 refills  Future visit scheduled yes 05/14/21  Notes to clinic: Not delegated  Requested Prescriptions  Pending Prescriptions Disp Refills   cyclobenzaprine (FLEXERIL) 10 MG tablet [Pharmacy Med Name: CYCLOBENZAPRINE 10 MG TABLET] 30 tablet 0    Sig: TAKE 1 TABLET BY MOUTH AT BEDTIME AS NEEDED FOR MUSCLE SPASMS     Not Delegated - Analgesics:  Muscle Relaxants Failed - 03/11/2021 10:07 AM      Failed - This refill cannot be delegated      Passed - Valid encounter within last 6 months    Recent Outpatient Visits           2 months ago Generalized anxiety disorder   [facility] [place], Megan P, DO   4 months ago TMJ (temporomandibular joint disorder)   [facility] [place], Megan P, DO   7 months ago Generalized anxiety disorder   [facility], Megan P, DO   7 months ago Generalized anxiety disorder   [facility] [facility], Megan P, DO   10 months ago Routine general medical examination at a health care facility   [facility] [place], [place], DO       Future Appointments             In 2 months Laural Benes, Oralia Rud, DO [employer]

## 2021-05-07 ENCOUNTER — Encounter: Payer: Self-pay | Admitting: Family Medicine

## 2021-05-14 ENCOUNTER — Encounter: Payer: Self-pay | Admitting: Family Medicine

## 2021-06-06 ENCOUNTER — Other Ambulatory Visit: Payer: Self-pay

## 2021-06-06 ENCOUNTER — Ambulatory Visit (INDEPENDENT_AMBULATORY_CARE_PROVIDER_SITE_OTHER): Payer: BC Managed Care – PPO | Admitting: Family Medicine

## 2021-06-06 ENCOUNTER — Encounter: Payer: Self-pay | Admitting: Family Medicine

## 2021-06-06 VITALS — BP 151/96 | HR 89 | Temp 98.3°F | Ht 75.0 in | Wt 212.6 lb

## 2021-06-06 DIAGNOSIS — Z0000 Encounter for general adult medical examination without abnormal findings: Secondary | ICD-10-CM | POA: Diagnosis not present

## 2021-06-06 DIAGNOSIS — I10 Essential (primary) hypertension: Secondary | ICD-10-CM | POA: Diagnosis not present

## 2021-06-06 DIAGNOSIS — Z021 Encounter for pre-employment examination: Secondary | ICD-10-CM

## 2021-06-06 DIAGNOSIS — Z1211 Encounter for screening for malignant neoplasm of colon: Secondary | ICD-10-CM | POA: Diagnosis not present

## 2021-06-06 DIAGNOSIS — F411 Generalized anxiety disorder: Secondary | ICD-10-CM | POA: Diagnosis not present

## 2021-06-06 LAB — URINALYSIS, ROUTINE W REFLEX MICROSCOPIC
Bilirubin, UA: NEGATIVE
Glucose, UA: NEGATIVE
Ketones, UA: NEGATIVE
Leukocytes,UA: NEGATIVE
Nitrite, UA: NEGATIVE
Protein,UA: NEGATIVE
RBC, UA: NEGATIVE
Specific Gravity, UA: >1.03 — ABNORMAL HIGH (ref 1.005–1.030)
Urobilinogen, Ur: 0.2 mg/dL (ref 0.2–1.0)
pH, UA: 5.5 (ref 5.0–7.5)

## 2021-06-06 LAB — MICROALBUMIN, URINE WAIVED
Creatinine, Urine Waived: 200 mg/dL (ref 10–300)
Microalb, Ur Waived: 10 mg/L (ref 0–19)
Microalb/Creat Ratio: <30 mg/g (ref <30)

## 2021-06-06 MED ORDER — LISINOPRIL 10 MG PO TABS: 10.0000 mg | ORAL_TABLET | Freq: Every day | ORAL | 3 refills | Status: DC

## 2021-06-06 MED ORDER — SERTRALINE HCL 100 MG PO TABS: 100.0000 mg | ORAL_TABLET | Freq: Every day | ORAL | 1 refills | Status: DC

## 2021-06-06 MED ORDER — LORAZEPAM 0.5 MG PO TABS: 0.5000 mg | ORAL_TABLET | Freq: Two times a day (BID) | ORAL | 0 refills | Status: DC | PRN

## 2021-06-06 MED ORDER — ESOMEPRAZOLE MAGNESIUM 40 MG PO CPDR: 40.0000 mg | DELAYED_RELEASE_CAPSULE | Freq: Every day | ORAL | 3 refills | Status: DC

## 2021-06-06 NOTE — Assessment & Plan Note (Signed)
Not under good control. Will start lisinopril and recheck 1 month. Call with any concerns.  

## 2021-06-06 NOTE — Assessment & Plan Note (Signed)
Under good control on current regimen. Continue current regimen. Continue to monitor. Call with any concerns. Refills given. Follow up 6 months  

## 2021-06-06 NOTE — Progress Notes (Signed)
BP (!) 151/96    Pulse 89    Temp 98.3 F (36.8 C)    Ht 6\' 3"  (1.905 m)    Wt 212 lb 9.6 oz (96.4 kg)    SpO2 96%    BMI 26.57 kg/m    Subjective:    Patient ID: Shawn Andrade, male    DOB: 03/17/1976, 45 y.o.   MRN: [id number]  HPI: Shawn Andrade is a 45 y.o. male presenting on 06/06/2021 for comprehensive medical examination. Current medical complaints include:  ANXIETY/STRESS- lost his job in November. Has been under a lot of stress since then.  Duration: chronic Status:exacerbated Anxious mood: yes  Excessive worrying: yes Irritability: no  Sweating: no Nausea: no Palpitations:no Hyperventilation: no Panic attacks: no Agoraphobia: no  Obscessions/compulsions: no Depressed mood: yes Depression screen [facility] 2/9 06/06/2021 01/09/2021 08/13/2020 07/16/2020 05/13/2020  Decreased Interest 1 1 0 0 0  Down, Depressed, Hopeless 0 0 0 0 0  PHQ - 2 Score 1 1 0 0 0  Altered sleeping 1 2 2 1  0  Tired, decreased energy 0 3 2 1 1   Change in appetite 0 0 0 0 0  Feeling bad or failure about yourself  0 0 0 0 0  Trouble concentrating 0 0 0 1 0  Moving slowly or fidgety/restless 0 0 0 1 0  Suicidal thoughts 0 0 0 0 0  PHQ-9 Score 2 6 4 4 1   Difficult doing work/chores - Not difficult at all Not difficult at all Somewhat difficult Not difficult at all  Some recent data might be hidden   Anhedonia: no Weight changes: no Insomnia: no   Hypersomnia: no Fatigue/loss of energy: yes Feelings of worthlessness: no Feelings of guilt: no Impaired concentration/indecisiveness: no Suicidal ideations: no  Crying spells: no Recent Stressors/Life Changes: yes   Relationship problems: no   Family stress: yes     Financial stress: yes    Job stress: yes    Recent death/loss: no  HYPERTENSION Hypertension status: uncontrolled  Satisfied with current treatment? no Duration of hypertension: chronic BP monitoring frequency:  not checking Previous BP meds:none Aspirin: no Recurrent headaches:  no Visual changes: no Palpitations: no Dyspnea: no Chest pain: no Lower extremity edema: no Dizzy/lightheaded: no  He currently lives with: wife and kids Interim Problems from his last visit: no  Depression Screen done today and results listed below:  Depression screen [facility] 2/9 06/06/2021 01/09/2021 08/13/2020 07/16/2020 05/13/2020  Decreased Interest 1 1 0 0 0  Down, Depressed, Hopeless 0 0 0 0 0  PHQ - 2 Score 1 1 0 0 0  Altered sleeping 1 2 2 1  0  Tired, decreased energy 0 3 2 1 1   Change in appetite 0 0 0 0 0  Feeling bad or failure about yourself  0 0 0 0 0  Trouble concentrating 0 0 0 1 0  Moving slowly or fidgety/restless 0 0 0 1 0  Suicidal thoughts 0 0 0 0 0  PHQ-9 Score 2 6 4 4 1   Difficult doing work/chores - Not difficult at all Not difficult at all Somewhat difficult Not difficult at all  Some recent data might be hidden    Past Medical History:  Past Medical History:  Diagnosis Date   Allergy    Anxiety    Elevated blood pressure    IFG (impaired fasting glucose)     Surgical History:  Past Surgical History:  Procedure Laterality Date   NO PAST SURGERIES  Medications:  Current Outpatient Medications on File Prior to Visit  Medication Sig   cyclobenzaprine (FLEXERIL) 10 MG tablet TAKE 1 TABLET BY MOUTH AT BEDTIME AS NEEDED FOR MUSCLE SPASMS.   No current facility-administered medications on file prior to visit.    Allergies:  No Known Allergies  Social History:  Social History   Socioeconomic History   Marital status: Married    Spouse name: Not on file   Number of children: 2   Years of education: Not on file   Highest education level: Not on file  Occupational History   Occupation: FIREFIGHTER    Employer: [employer]  Tobacco Use   Smoking status: Every Day    Packs/day: 1.00    Years: 15.00    Pack years: 15.00    Types: Cigarettes   Smokeless tobacco: Current    Types: Snuff  Vaping Use   Vaping Use: Never used  Substance  and Sexual Activity   Alcohol use: Yes    Alcohol/week: 0.0 standard drinks    Comment: one or less per day, rare   Drug use: No   Sexual activity: Yes  Other Topics Concern   Not on file  Social History Narrative   Not on file   Social Determinants of Health   Financial Resource Strain: Not on file  Food Insecurity: Not on file  Transportation Needs: Not on file  Physical Activity: Not on file  Stress: Not on file  Social Connections: Not on file  Intimate Partner Violence: Not on file   Social History   Tobacco Use  Smoking Status Every Day   Packs/day: 1.00   Years: 15.00   Pack years: 15.00   Types: Cigarettes  Smokeless Tobacco Current   Types: Snuff   Social History   Substance and Sexual Activity  Alcohol Use Yes   Alcohol/week: 0.0 standard drinks   Comment: one or less per day, rare    Family History:  Family History  Problem Relation Age of Onset   Hypertension Father    Hypertension Mother    Anxiety disorder Mother    Cancer Paternal Grandfather     Past medical history, surgical history, medications, allergies, family history and social history reviewed with patient today and changes made to appropriate areas of the chart.   Review of Systems  Constitutional: Negative.   HENT: Negative.    Eyes: Negative.   Respiratory: Negative.    Cardiovascular: Negative.   Gastrointestinal:  Positive for heartburn. Negative for abdominal pain, blood in stool, constipation, diarrhea, melena, nausea and vomiting.  Genitourinary: Negative.   Musculoskeletal: Negative.   Skin: Negative.   Neurological: Negative.   Endo/Heme/Allergies: Negative.   Psychiatric/Behavioral:  Positive for depression. Negative for hallucinations, memory loss, substance abuse and suicidal ideas. The patient is nervous/anxious. The patient does not have insomnia.   All other ROS negative except what is listed above and in the HPI.      Objective:    BP (!) 151/96    Pulse 89     Temp 98.3 F (36.8 C)    Ht 6\' 3"  (1.905 m)    Wt 212 lb 9.6 oz (96.4 kg)    SpO2 96%    BMI 26.57 kg/m   Wt Readings from Last 3 Encounters:  06/06/21 212 lb 9.6 oz (96.4 kg)  01/09/21 198 lb 6.4 oz (90 kg)  10/29/20 193 lb (87.5 kg)    Physical Exam Vitals and nursing note reviewed.  Constitutional:  General: He is not in acute distress.    Appearance: Normal appearance. He is obese. He is not ill-appearing, toxic-appearing or diaphoretic.  HENT:     Head: Normocephalic and atraumatic.     Right Ear: Tympanic membrane, ear canal and external ear normal. There is no impacted cerumen.     Left Ear: Tympanic membrane, ear canal and external ear normal. There is no impacted cerumen.     Nose: Nose normal. No congestion or rhinorrhea.     Mouth/Throat:     Mouth: Mucous membranes are moist.     Pharynx: Oropharynx is clear. No oropharyngeal exudate or posterior oropharyngeal erythema.  Eyes:     General: No scleral icterus.       Right eye: No discharge.        Left eye: No discharge.     Extraocular Movements: Extraocular movements intact.     Conjunctiva/sclera: Conjunctivae normal.     Pupils: Pupils are equal, round, and reactive to light.  Neck:     Vascular: No carotid bruit.  Cardiovascular:     Rate and Rhythm: Normal rate and regular rhythm.     Pulses: Normal pulses.     Heart sounds: No murmur heard.   No friction rub. No gallop.  Pulmonary:     Effort: Pulmonary effort is normal. No respiratory distress.     Breath sounds: Normal breath sounds. No stridor. No wheezing, rhonchi or rales.  Chest:     Chest wall: No tenderness.  Abdominal:     General: Abdomen is flat. Bowel sounds are normal. There is no distension.     Palpations: Abdomen is soft. There is no mass.     Tenderness: There is no abdominal tenderness. There is no right CVA tenderness, left CVA tenderness, guarding or rebound.     Hernia: No hernia is present.  Genitourinary:    Comments:  Genital exam deferred with shared decision making Musculoskeletal:        General: No swelling, tenderness, deformity or signs of injury.     Cervical back: Normal range of motion and neck supple. No rigidity. No muscular tenderness.     Right lower leg: No edema.     Left lower leg: No edema.  Lymphadenopathy:     Cervical: No cervical adenopathy.  Skin:    General: Skin is warm and dry.     Capillary Refill: Capillary refill takes less than 2 seconds.     Coloration: Skin is not jaundiced or pale.     Findings: No bruising, erythema, lesion or rash.  Neurological:     General: No focal deficit present.     Mental Status: He is alert and oriented to person, place, and time.     Cranial Nerves: No cranial nerve deficit.     Sensory: No sensory deficit.     Motor: No weakness.     Coordination: Coordination normal.     Gait: Gait normal.     Deep Tendon Reflexes: Reflexes normal.  Psychiatric:        Mood and Affect: Mood normal.        Behavior: Behavior normal.        Thought Content: Thought content normal.        Judgment: Judgment normal.    Results for orders placed or performed in visit on 05/13/20  CBC with Differential/Platelet  Result Value Ref Range   WBC 6.5 3.4 - 10.8 x10E3/uL   RBC 5.32 4.14 - 5.80  x10E6/uL   Hemoglobin 15.2 13.0 - 17.7 g/dL   Hematocrit 43.8 37.5 - 51.0 %   MCV 82 79 - 97 fL   MCH 28.6 26.6 - 33.0 pg   MCHC 34.7 31.5 - 35.7 g/dL   RDW 12.7 11.6 - 15.4 %   Platelets 264 150 - 450 x10E3/uL   Neutrophils 59 Not Estab. %   Lymphs 29 Not Estab. %   Monocytes 7 Not Estab. %   Eos 5 Not Estab. %   Basos 0 Not Estab. %   Neutrophils Absolute 3.8 1.4 - 7.0 x10E3/uL   Lymphocytes Absolute 1.9 0.7 - 3.1 x10E3/uL   Monocytes Absolute 0.5 0.1 - 0.9 x10E3/uL   EOS (ABSOLUTE) 0.3 0.0 - 0.4 x10E3/uL   Basophils Absolute 0.0 0.0 - 0.2 x10E3/uL   Immature Granulocytes 0 Not Estab. %   Immature Grans (Abs) 0.0 0.0 - 0.1 x10E3/uL  Comprehensive  metabolic panel  Result Value Ref Range   Glucose 98 65 - 99 mg/dL   BUN 15 6 - 24 mg/dL   Creatinine, Ser 0.83 0.76 - 1.27 mg/dL   GFR calc non Af Amer 106 >59 mL/min/1.73   GFR calc Af Amer 123 >59 mL/min/1.73   BUN/Creatinine Ratio 18 9 - 20   Sodium 140 134 - 144 mmol/L   Potassium 4.1 3.5 - 5.2 mmol/L   Chloride 101 96 - 106 mmol/L   CO2 22 20 - 29 mmol/L   Calcium 9.7 8.7 - 10.2 mg/dL   Total Protein 7.3 6.0 - 8.5 g/dL   Albumin 4.6 4.0 - 5.0 g/dL   Globulin, Total 2.7 1.5 - 4.5 g/dL   Albumin/Globulin Ratio 1.7 1.2 - 2.2   Bilirubin Total 0.4 0.0 - 1.2 mg/dL   Alkaline Phosphatase 80 44 - 121 IU/L   AST 16 0 - 40 IU/L   ALT 17 0 - 44 IU/L  Lipid Panel w/o Chol/HDL Ratio  Result Value Ref Range   Cholesterol, Total 221 (H) 100 - 199 mg/dL   Triglycerides 124 0 - 149 mg/dL   HDL 47 >39 mg/dL   VLDL Cholesterol Cal 22 5 - 40 mg/dL   LDL Chol Calc (NIH) 152 (H) 0 - 99 mg/dL  TSH  Result Value Ref Range   TSH 0.567 0.450 - 4.500 uIU/mL  Urinalysis, Routine w reflex microscopic  Result Value Ref Range   Specific Gravity, UA 1.015 1.005 - 1.030   pH, UA 5.5 5.0 - 7.5   Color, UA Yellow Yellow   Appearance Ur Clear Clear   Leukocytes,UA Negative Negative   Protein,UA Negative Negative/Trace   Glucose, UA Negative Negative   Ketones, UA Negative Negative   RBC, UA Negative Negative   Bilirubin, UA Negative Negative   Urobilinogen, Ur 1.0 0.2 - 1.0 mg/dL   Nitrite, UA Negative Negative  Hepatitis C Antibody  Result Value Ref Range   Hep C Virus Ab 0.2 0.0 - 0.9 s/co ratio      Assessment & Plan:   Problem List Items Addressed This Visit       Cardiovascular and Mediastinum   Primary hypertension    Not under good control. Will start lisinopril and recheck 1 month. Call with any concerns.       Relevant Medications   lisinopril (ZESTRIL) 10 MG tablet   Other Relevant Orders   Microalbumin, Urine Waived     Other   Anxiety disorder    Under good control  on current regimen. Continue current  regimen. Continue to monitor. Call with any concerns. Refills given. Follow up 6 months.        Relevant Medications   sertraline (ZOLOFT) 100 MG tablet   LORazepam (ATIVAN) 0.5 MG tablet   Other Visit Diagnoses     Routine general medical examination at a health care facility    -  Primary   Vaccines declined. Screening labs checked today. Cologuard ordered. Continue diet and exercise. Call with any concerns.    Relevant Orders   Comprehensive metabolic panel   CBC with Differential/Platelet   Lipid Panel w/o Chol/HDL Ratio   TSH   Urinalysis, Routine w reflex microscopic   Screening for colon cancer       Cologuard ordered today. Await results.    Relevant Orders   Cologuard   Pre-employment examination       Las drawn today. Await results. Treat as needed.    Relevant Orders   QuantiFERON-TB Gold Plus   Measles/Mumps/Rubella Immunity   Varicella Zoster Abs, IgG/IgM   Hepatitis B surface antibody,quantitative       LABORATORY TESTING:  Health maintenance labs ordered today as discussed above.   The natural history of prostate cancer and ongoing controversy regarding screening and potential treatment outcomes of prostate cancer has been discussed with the patient. The meaning of a false positive PSA and a false negative PSA has been discussed. He indicates understanding of the limitations of this screening test and wishes to proceed with screening PSA testing.   IMMUNIZATIONS:   - Tdap: Tetanus vaccination status reviewed: last tetanus booster within 10 years. - Influenza: Refused - Pneumovax: Refused - Prevnar: Not applicable - COVID: Refused   SCREENING: - Colonoscopy: cologuard ordered today  Discussed with patient purpose of the colonoscopy is to detect colon cancer at curable precancerous or early stages    PATIENT COUNSELING:    Sexuality: Discussed sexually transmitted diseases, partner selection, use of condoms,  avoidance of unintended pregnancy  and contraceptive alternatives.   Advised to avoid cigarette smoking.  I discussed with the patient that most people either abstain from alcohol or drink within safe limits (<=14/week and <=4 drinks/occasion for males, <=7/weeks and <= 3 drinks/occasion for females) and that the risk for alcohol disorders and other health effects rises proportionally with the number of drinks per week and how often a drinker exceeds daily limits.  Discussed cessation/primary prevention of drug use and availability of treatment for abuse.   Diet: Encouraged to adjust caloric intake to maintain  or achieve ideal body weight, to reduce intake of dietary saturated fat and total fat, to limit sodium intake by avoiding high sodium foods and not adding table salt, and to maintain adequate dietary potassium and calcium preferably from fresh fruits, vegetables, and low-fat dairy products.    stressed the importance of regular exercise  Injury prevention: Discussed safety belts, safety helmets, smoke detector, smoking near bedding or upholstery.   Dental health: Discussed importance of regular tooth brushing, flossing, and dental visits.   Follow up plan: NEXT PREVENTATIVE PHYSICAL DUE IN 1 YEAR. Return in about 4 weeks (around 07/04/2021).

## 2021-06-11 ENCOUNTER — Other Ambulatory Visit: Payer: Self-pay | Admitting: Family Medicine

## 2021-06-11 LAB — LIPID PANEL W/O CHOL/HDL RATIO
Cholesterol, Total: 224 mg/dL — ABNORMAL HIGH (ref 100–199)
HDL: 44 mg/dL (ref >39)
LDL Chol Calc (NIH): 134 mg/dL — ABNORMAL HIGH (ref 0–99)
Triglycerides: 257 mg/dL — ABNORMAL HIGH (ref 0–149)
VLDL Cholesterol Cal: 46 mg/dL — ABNORMAL HIGH (ref 5–40)

## 2021-06-11 LAB — COMPREHENSIVE METABOLIC PANEL
ALT: 23 IU/L (ref 0–44)
AST: 19 IU/L (ref 0–40)
Albumin/Globulin Ratio: 1.9 (ref 1.2–2.2)
Albumin: 4.7 g/dL (ref 4.0–5.0)
Alkaline Phosphatase: 80 IU/L (ref 44–121)
BUN/Creatinine Ratio: 23 — ABNORMAL HIGH (ref 9–20)
BUN: 18 mg/dL (ref 6–24)
Bilirubin Total: 0.5 mg/dL (ref 0.0–1.2)
CO2: 24 mmol/L (ref 20–29)
Calcium: 9.6 mg/dL (ref 8.7–10.2)
Chloride: 101 mmol/L (ref 96–106)
Creatinine, Ser: 0.8 mg/dL (ref 0.76–1.27)
Globulin, Total: 2.5 g/dL (ref 1.5–4.5)
Glucose: 110 mg/dL — ABNORMAL HIGH (ref 70–99)
Potassium: 4.1 mmol/L (ref 3.5–5.2)
Sodium: 140 mmol/L (ref 134–144)
Total Protein: 7.2 g/dL (ref 6.0–8.5)
eGFR: 111 mL/min/{1.73_m2} (ref >59)

## 2021-06-11 LAB — TSH: TSH: 0.82 u[IU]/mL (ref 0.450–4.500)

## 2021-06-11 LAB — CBC WITH DIFFERENTIAL/PLATELET
Basophils Absolute: 0 10*3/uL (ref 0.0–0.2)
Basos: 0 %
EOS (ABSOLUTE): 0.3 10*3/uL (ref 0.0–0.4)
Eos: 5 %
Hematocrit: 44.1 % (ref 37.5–51.0)
Hemoglobin: 15.3 g/dL (ref 13.0–17.7)
Immature Grans (Abs): 0 10*3/uL (ref 0.0–0.1)
Immature Granulocytes: 0 %
Lymphocytes Absolute: 2 10*3/uL (ref 0.7–3.1)
Lymphs: 29 %
MCH: 29.1 pg (ref 26.6–33.0)
MCHC: 34.7 g/dL (ref 31.5–35.7)
MCV: 84 fL (ref 79–97)
Monocytes Absolute: 0.4 10*3/uL (ref 0.1–0.9)
Monocytes: 6 %
Neutrophils Absolute: 4.1 10*3/uL (ref 1.4–7.0)
Neutrophils: 60 %
Platelets: 243 10*3/uL (ref 150–450)
RBC: 5.26 x10E6/uL (ref 4.14–5.80)
RDW: 13 % (ref 11.6–15.4)
WBC: 6.9 10*3/uL (ref 3.4–10.8)

## 2021-06-11 LAB — HEPATITIS B SURFACE ANTIBODY, QUANTITATIVE: Hepatitis B Surf Ab Quant: 224.3 m[IU]/mL (ref >9.9)

## 2021-06-11 LAB — QUANTIFERON-TB GOLD PLUS
QuantiFERON Mitogen Value: >10 IU/mL
QuantiFERON Nil Value: 0 IU/mL
QuantiFERON TB1 Ag Value: 0.01 IU/mL
QuantiFERON TB2 Ag Value: 0 IU/mL
QuantiFERON-TB Gold Plus: NEGATIVE

## 2021-06-11 LAB — MEASLES/MUMPS/RUBELLA IMMUNITY
MUMPS ABS, IGG: 26.4 AU/mL (ref >10.9)
RUBEOLA AB, IGG: 14.2 AU/mL — ABNORMAL LOW (ref >16.4)
Rubella Antibodies, IGG: <0.9 index — ABNORMAL LOW (ref >0.99)

## 2021-06-11 LAB — VARICELLA ZOSTER ABS, IGG/IGM
Varicella IgM: <0.91 index (ref 0.00–0.90)
Varicella zoster IgG: <135 index — ABNORMAL LOW (ref >165)

## 2021-06-11 NOTE — Progress Notes (Signed)
Quantiferon drawn last week. Just needs to drop off form.

## 2021-06-12 ENCOUNTER — Telehealth: Payer: Self-pay | Admitting: Family Medicine

## 2021-06-12 NOTE — Telephone Encounter (Signed)
Pt dropped off health examination school paperwork for the provider to fill out and sign.  Once completed, please call pt at [phone].  Pt will pick up paperwork.  Placed in provider's folder.

## 2021-07-04 ENCOUNTER — Ambulatory Visit: Payer: BC Managed Care – PPO | Admitting: Family Medicine

## 2021-08-04 ENCOUNTER — Other Ambulatory Visit: Payer: Self-pay | Admitting: Family Medicine

## 2021-08-05 NOTE — Telephone Encounter (Signed)
Left message for patient trying to gather information in regards to his refill request. Dr.Johnson would like to know if patient has enough medication to last until his scheduled appointment for Thursday. Advised patient to give our office a call back to discuss.

## 2021-08-05 NOTE — Telephone Encounter (Signed)
Requested medications are due for refill today.  A bit too soon  Requested medications are on the active medications list.  yes  Last refill. 06/06/2021 #30 3 refills  Future visit scheduled.   no  Notes to clinic.  Pt seen on 06/06/2021. This was a new rx for pt. Pt was to RTC for follow up 07/04/2021, pt did not show. Please advise.    Requested Prescriptions  Pending Prescriptions Disp Refills   lisinopril (ZESTRIL) 10 MG tablet [Pharmacy Med Name: LISINOPRIL 10 MG TABLET] 90 tablet 1    Sig: TAKE 1 TABLET BY MOUTH EVERY DAY     Cardiovascular:  ACE Inhibitors Failed - 08/04/2021 12:17 PM      Failed - Last BP in normal range    BP Readings from Last 1 Encounters:  06/06/21 (!) 151/96          Passed - Cr in normal range and within 180 days    Creatinine, Ser  Date Value Ref Range Status  06/06/2021 0.80 0.76 - 1.27 mg/dL Final          Passed - K in normal range and within 180 days    Potassium  Date Value Ref Range Status  06/06/2021 4.1 3.5 - 5.2 mmol/L Final          Passed - Patient is not pregnant      Passed - Valid encounter within last 6 months    Recent Outpatient Visits           2 months ago Routine general medical examination at a health care facility   [facility] [place] P, DO   6 months ago Generalized anxiety disorder   [facility] [place], Megan P, DO   9 months ago TMJ (temporomandibular joint disorder)   [facility] [place], Megan P, DO   11 months ago Generalized anxiety disorder   [facility] [place], [place], DO   1 year ago Generalized anxiety disorder   [facility], [place], DO

## 2021-08-05 NOTE — Telephone Encounter (Signed)
Please make sure he has enough to get to Thursday

## 2021-08-07 ENCOUNTER — Ambulatory Visit (INDEPENDENT_AMBULATORY_CARE_PROVIDER_SITE_OTHER): Payer: BC Managed Care – PPO | Admitting: Family Medicine

## 2021-08-07 ENCOUNTER — Encounter: Payer: Self-pay | Admitting: Family Medicine

## 2021-08-07 ENCOUNTER — Other Ambulatory Visit: Payer: Self-pay

## 2021-08-07 DIAGNOSIS — I10 Essential (primary) hypertension: Secondary | ICD-10-CM | POA: Diagnosis not present

## 2021-08-07 MED ORDER — LISINOPRIL 10 MG PO TABS
10.0000 mg | ORAL_TABLET | Freq: Every day | ORAL | 1 refills | Status: DC
Start: 2021-08-07 — End: 2022-01-20

## 2021-08-07 NOTE — Progress Notes (Signed)
? ?BP 126/77   Pulse 84   Temp 97.9 ?F (36.6 ?C) (Oral)   Wt 212 lb 12.8 oz (96.5 kg)   SpO2 97%   BMI 26.60 kg/m?   ? ?Subjective:  ? ? Patient ID: Shawn Andrade, male    DOB: 16-Sep-1975, 46 y.o.   MRN: [id number] ? ?HPI: ?Shawn Andrade is a 46 y.o. male ? ?Chief Complaint  ?Patient presents with  ? Hypertension  ? ?HYPERTENSION ?Hypertension status: controlled  ?Satisfied with current treatment? yes ?Duration of hypertension: chronic ?BP monitoring frequency:  not checking ?BP medication side effects:  no ?Medication compliance: excellent compliance ?Previous BP meds:lisinopril ?Aspirin: no ?Recurrent headaches: no ?Visual changes: no ?Palpitations: no ?Dyspnea: no ?Chest pain: no ?Lower extremity edema: no ?Dizzy/lightheaded: no ? ?Relevant past medical, surgical, family and social history reviewed and updated as indicated. Interim medical history since our last visit reviewed. ?Allergies and medications reviewed and updated. ? ?Review of Systems  ?Constitutional: Negative.   ?Respiratory: Negative.    ?Cardiovascular: Negative.   ?Musculoskeletal: Negative.   ?Psychiatric/Behavioral: Negative.    ? ?Per HPI unless specifically indicated above ? ?   ?Objective:  ?  ?BP 126/77   Pulse 84   Temp 97.9 ?F (36.6 ?C) (Oral)   Wt 212 lb 12.8 oz (96.5 kg)   SpO2 97%   BMI 26.60 kg/m?   ?Wt Readings from Last 3 Encounters:  ?08/07/21 212 lb 12.8 oz (96.5 kg)  ?06/06/21 212 lb 9.6 oz (96.4 kg)  ?01/09/21 198 lb 6.4 oz (90 kg)  ?  ?Physical Exam ?Vitals and nursing note reviewed.  ?Constitutional:   ?   General: He is not in acute distress. ?   Appearance: Normal appearance. He is not ill-appearing, toxic-appearing or diaphoretic.  ?HENT:  ?   Head: Normocephalic and atraumatic.  ?   Right Ear: External ear normal.  ?   Left Ear: External ear normal.  ?   Nose: Nose normal.  ?   Mouth/Throat:  ?   Mouth: Mucous membranes are moist.  ?   Pharynx: Oropharynx is clear.  ?Eyes:  ?   General: No scleral icterus.    ?    Right eye: No discharge.     ?   Left eye: No discharge.  ?   Extraocular Movements: Extraocular movements intact.  ?   Conjunctiva/sclera: Conjunctivae normal.  ?   Pupils: Pupils are equal, round, and reactive to light.  ?Cardiovascular:  ?   Rate and Rhythm: Normal rate and regular rhythm.  ?   Pulses: Normal pulses.  ?   Heart sounds: Normal heart sounds. No murmur heard. ?  No friction rub. No gallop.  ?Pulmonary:  ?   Effort: Pulmonary effort is normal. No respiratory distress.  ?   Breath sounds: Normal breath sounds. No stridor. No wheezing, rhonchi or rales.  ?Chest:  ?   Chest wall: No tenderness.  ?Musculoskeletal:     ?   General: Normal range of motion.  ?   Cervical back: Normal range of motion and neck supple.  ?Skin: ?   General: Skin is warm and dry.  ?   Capillary Refill: Capillary refill takes less than 2 seconds.  ?   Coloration: Skin is not jaundiced or pale.  ?   Findings: No bruising, erythema, lesion or rash.  ?Neurological:  ?   General: No focal deficit present.  ?   Mental Status: He is alert and oriented to person, place,  and time. Mental status is at baseline.  ?Psychiatric:     ?   Mood and Affect: Mood normal.     ?   Behavior: Behavior normal.     ?   Thought Content: Thought content normal.     ?   Judgment: Judgment normal.  ? ? ?Results for orders placed or performed in visit on 06/06/21  ?Comprehensive metabolic panel  ?Result Value Ref Range  ? Glucose 110 (H) 70 - 99 mg/dL  ? BUN 18 6 - 24 mg/dL  ? Creatinine, Ser 0.80 0.76 - 1.27 mg/dL  ? eGFR 111 >59 mL/min/1.73  ? BUN/Creatinine Ratio 23 (H) 9 - 20  ? Sodium 140 134 - 144 mmol/L  ? Potassium 4.1 3.5 - 5.2 mmol/L  ? Chloride 101 96 - 106 mmol/L  ? CO2 24 20 - 29 mmol/L  ? Calcium 9.6 8.7 - 10.2 mg/dL  ? Total Protein 7.2 6.0 - 8.5 g/dL  ? Albumin 4.7 4.0 - 5.0 g/dL  ? Globulin, Total 2.5 1.5 - 4.5 g/dL  ? Albumin/Globulin Ratio 1.9 1.2 - 2.2  ? Bilirubin Total 0.5 0.0 - 1.2 mg/dL  ? Alkaline Phosphatase 80 44 - 121 IU/L  ?  AST 19 0 - 40 IU/L  ? ALT 23 0 - 44 IU/L  ?CBC with Differential/Platelet  ?Result Value Ref Range  ? WBC 6.9 3.4 - 10.8 x10E3/uL  ? RBC 5.26 4.14 - 5.80 x10E6/uL  ? Hemoglobin 15.3 13.0 - 17.7 g/dL  ? Hematocrit 44.1 37.5 - 51.0 %  ? MCV 84 79 - 97 fL  ? MCH 29.1 26.6 - 33.0 pg  ? MCHC 34.7 31.5 - 35.7 g/dL  ? RDW 13.0 11.6 - 15.4 %  ? Platelets 243 150 - 450 x10E3/uL  ? Neutrophils 60 Not Estab. %  ? Lymphs 29 Not Estab. %  ? Monocytes 6 Not Estab. %  ? Eos 5 Not Estab. %  ? Basos 0 Not Estab. %  ? Neutrophils Absolute 4.1 1.4 - 7.0 x10E3/uL  ? Lymphocytes Absolute 2.0 0.7 - 3.1 x10E3/uL  ? Monocytes Absolute 0.4 0.1 - 0.9 x10E3/uL  ? EOS (ABSOLUTE) 0.3 0.0 - 0.4 x10E3/uL  ? Basophils Absolute 0.0 0.0 - 0.2 x10E3/uL  ? Immature Granulocytes 0 Not Estab. %  ? Immature Grans (Abs) 0.0 0.0 - 0.1 x10E3/uL  ?Lipid Panel w/o Chol/HDL Ratio  ?Result Value Ref Range  ? Cholesterol, Total 224 (H) 100 - 199 mg/dL  ? Triglycerides 257 (H) 0 - 149 mg/dL  ? HDL 44 >39 mg/dL  ? VLDL Cholesterol Cal 46 (H) 5 - 40 mg/dL  ? LDL Chol Calc (NIH) 134 (H) 0 - 99 mg/dL  ?TSH  ?Result Value Ref Range  ? TSH 0.820 0.450 - 4.500 uIU/mL  ?Urinalysis, Routine w reflex microscopic  ?Result Value Ref Range  ? Specific Gravity, UA >1.030 (H) 1.005 - 1.030  ? pH, UA 5.5 5.0 - 7.5  ? Color, UA Yellow Yellow  ? Appearance Ur Clear Clear  ? Leukocytes,UA Negative Negative  ? Protein,UA Negative Negative/Trace  ? Glucose, UA Negative Negative  ? Ketones, UA Negative Negative  ? RBC, UA Negative Negative  ? Bilirubin, UA Negative Negative  ? Urobilinogen, Ur 0.2 0.2 - 1.0 mg/dL  ? Nitrite, UA Negative Negative  ?Microalbumin, Urine Waived  ?Result Value Ref Range  ? Microalb, Ur Waived 10 0 - 19 mg/L  ? Creatinine, Urine Waived 200 10 - 300 mg/dL  ? Microalb/Creat  Ratio <30 <30 mg/g  ?QuantiFERON-TB Gold Plus  ?Result Value Ref Range  ? QuantiFERON Incubation Incubation performed.   ? QuantiFERON Criteria Comment   ? QuantiFERON TB1 Ag Value 0.01  IU/mL  ? QuantiFERON TB2 Ag Value 0.00 IU/mL  ? QuantiFERON Nil Value 0.00 IU/mL  ? QuantiFERON Mitogen Value >10.00 IU/mL  ? QuantiFERON-TB Gold Plus Negative Negative  ?Measles/Mumps/Rubella Immunity  ?Result Value Ref Range  ? Rubella Antibodies, IGG <0.90 (L) Immune >0.99 index  ? RUBEOLA AB, IGG 14.2 (L) Immune >16.4 AU/mL  ? MUMPS ABS, IGG 26.4 Immune >10.9 AU/mL  ?Varicella Zoster Abs, IgG/IgM  ?Result Value Ref Range  ? Varicella zoster IgG <135 (L) Immune >165 index  ? Varicella IgM <0.91 0.00 - 0.90 index  ?Hepatitis B surface antibody,quantitative  ?Result Value Ref Range  ? Hepatitis B Surf Ab Quant 224.3 Immunity>9.9 mIU/mL  ? ?   ?Assessment & Plan:  ? ?Problem List Items Addressed This Visit   ? ?  ? Cardiovascular and Mediastinum  ? Primary hypertension  ?  Under good control on current regimen. Continue current regimen. Continue to monitor. Call with any concerns. Refills given. Labs drawn today. ? ?  ?  ? Relevant Medications  ? lisinopril (ZESTRIL) 10 MG tablet  ?  ? ?Follow up plan: ?Return End of June for 6 month follow up. ? ? ? ? ? ?

## 2021-08-07 NOTE — Telephone Encounter (Signed)
Patient has scheduled appointment with Dr.Johnson today at 3:20. Patient can discuss at appointment today with provider.  ?

## 2021-08-07 NOTE — Assessment & Plan Note (Signed)
Under good control on current regimen. Continue current regimen. Continue to monitor. Call with any concerns. Refills given. Labs drawn today.   

## 2021-08-07 NOTE — Addendum Note (Signed)
Addended by: Dorcas Carrow on: 08/07/2021 03:35 PM ? ? Modules accepted: Orders ? ?

## 2021-08-08 LAB — BASIC METABOLIC PANEL
BUN/Creatinine Ratio: 19 (ref 9–20)
BUN: 14 mg/dL (ref 6–24)
CO2: 19 mmol/L — ABNORMAL LOW (ref 20–29)
Calcium: 9.5 mg/dL (ref 8.7–10.2)
Chloride: 97 mmol/L (ref 96–106)
Creatinine, Ser: 0.75 mg/dL — ABNORMAL LOW (ref 0.76–1.27)
Glucose: 120 mg/dL — ABNORMAL HIGH (ref 70–99)
Potassium: 3.8 mmol/L (ref 3.5–5.2)
Sodium: 136 mmol/L (ref 134–144)
eGFR: 113 mL/min/{1.73_m2} (ref >59)

## 2021-12-04 ENCOUNTER — Ambulatory Visit: Payer: BC Managed Care – PPO | Admitting: Family Medicine

## 2021-12-31 ENCOUNTER — Other Ambulatory Visit: Payer: Self-pay | Admitting: Family Medicine

## 2021-12-31 NOTE — Telephone Encounter (Signed)
Medication Refill - Medication: sertraline (ZOLOFT) 100 MG tablet,LORazepam (ATIVAN) 0.5 MG tablet  Pt mentioned his wife used a few of his medication tablets for LORazepam (ATIVAN) 0.5 MG as she was prescribed the same medication by her PCP but ran out.   Has the patient contacted their pharmacy? Yes.   No,more refills.   (Agent: If no, request that the patient contact the pharmacy for the refill. If patient does not wish to contact the pharmacy document the reason why and proceed with request.)   Preferred Pharmacy (with phone number or street name):  [id number] - [place], [place] - [address]  [address] [place] [address]  Phone: [phone] Fax: [phone]  Hours: Not open 24 hours   Has the patient been seen for an appointment in the last year OR does the patient have an upcoming appointment? Yes.    Agent: Please be advised that RX refills may take up to 3 business days. We ask that you follow-up with your pharmacy.

## 2022-01-01 MED ORDER — SERTRALINE HCL 100 MG PO TABS: 100.0000 mg | ORAL_TABLET | Freq: Every day | ORAL | 0 refills | Status: DC

## 2022-01-01 NOTE — Telephone Encounter (Signed)
Requested Prescriptions  Pending Prescriptions Disp Refills  . LORazepam (ATIVAN) 0.5 MG tablet 60 tablet 0    Sig: Take 1 tablet (0.5 mg total) by mouth 2 (two) times daily as needed for anxiety.     Not Delegated - Psychiatry: Anxiolytics/Hypnotics 2 Failed - 12/31/2021  3:51 PM      Failed - This refill cannot be delegated      Failed - Urine Drug Screen completed in last 360 days      Passed - Patient is not pregnant      Passed - Valid encounter within last 6 months    Recent Outpatient Visits          4 months ago Primary hypertension   [facility] [place], Megan P, DO   6 months ago Routine general medical examination at a health care facility   [facility], Megan P, DO   11 months ago Generalized anxiety disorder   [facility] [place], Megan P, DO   1 year ago TMJ (temporomandibular joint disorder)   [facility] [place], Megan P, DO   1 year ago Generalized anxiety disorder   [facility] [place], Megan P, DO             . sertraline (ZOLOFT) 100 MG tablet 90 tablet 1    Sig: Take 1 tablet (100 mg total) by mouth daily.     Psychiatry:  Antidepressants - SSRI - sertraline Passed - 12/31/2021  3:51 PM      Passed - AST in normal range and within 360 days    AST  Date Value Ref Range Status  06/06/2021 19 0 - 40 IU/L Final         Passed - ALT in normal range and within 360 days    ALT  Date Value Ref Range Status  06/06/2021 23 0 - 44 IU/L Final         Passed - Completed PHQ-2 or PHQ-9 in the last 360 days      Passed - Valid encounter within last 6 months    Recent Outpatient Visits          4 months ago Primary hypertension   [facility] [place], Megan P, DO   6 months ago Routine general medical examination at a health care facility   [facility], [facility] P, DO   11 months ago Generalized anxiety disorder   [facility], Megan  P, DO   1 year ago TMJ (temporomandibular joint disorder)   [facility] [place], Megan P, DO   1 year ago Generalized anxiety disorder   [facility] [place], [place], DO

## 2022-01-01 NOTE — Telephone Encounter (Signed)
Patient is overdue for 6 month f/up, was due at the end of June. Please call to schedule and then route to provider for refill.

## 2022-01-01 NOTE — Telephone Encounter (Signed)
Requested medication (s) are due for refill today: yes  Requested medication (s) are on the active medication list: yes  Last refill:  12/30/2  Future visit scheduled:no  Notes to clinic:  Unable to refill per protocol, cannot delegate.      Requested Prescriptions  Pending Prescriptions Disp Refills   LORazepam (ATIVAN) 0.5 MG tablet 60 tablet 0    Sig: Take 1 tablet (0.5 mg total) by mouth 2 (two) times daily as needed for anxiety.     Not Delegated - Psychiatry: Anxiolytics/Hypnotics 2 Failed - 12/31/2021  3:51 PM      Failed - This refill cannot be delegated      Failed - Urine Drug Screen completed in last 360 days      Passed - Patient is not pregnant      Passed - Valid encounter within last 6 months    Recent Outpatient Visits           4 months ago Primary hypertension   [facility] [place], Megan P, DO   6 months ago Routine general medical examination at a health care facility   [facility], Megan P, DO   11 months ago Generalized anxiety disorder   [facility] [place], Megan P, DO   1 year ago TMJ (temporomandibular joint disorder)   [facility] [place], Megan P, DO   1 year ago Generalized anxiety disorder   [facility] [place], Megan P, DO              Signed Prescriptions Disp Refills   sertraline (ZOLOFT) 100 MG tablet 90 tablet 0    Sig: Take 1 tablet (100 mg total) by mouth daily.     Psychiatry:  Antidepressants - SSRI - sertraline Passed - 12/31/2021  3:51 PM      Passed - AST in normal range and within 360 days    AST  Date Value Ref Range Status  06/06/2021 19 0 - 40 IU/L Final         Passed - ALT in normal range and within 360 days    ALT  Date Value Ref Range Status  06/06/2021 23 0 - 44 IU/L Final         Passed - Completed PHQ-2 or PHQ-9 in the last 360 days      Passed - Valid encounter within last 6 months    Recent Outpatient Visits           4  months ago Primary hypertension   [facility] [place], Megan P, DO   6 months ago Routine general medical examination at a health care facility   [facility], [facility] P, DO   11 months ago Generalized anxiety disorder   [facility] [place], Megan P, DO   1 year ago TMJ (temporomandibular joint disorder)   [facility] [place], Megan P, DO   1 year ago Generalized anxiety disorder   [facility] [place], [place], DO

## 2022-01-05 ENCOUNTER — Other Ambulatory Visit: Payer: Self-pay | Admitting: Family Medicine

## 2022-01-05 MED ORDER — LORAZEPAM 0.5 MG PO TABS
0.5000 mg | ORAL_TABLET | Freq: Two times a day (BID) | ORAL | 0 refills | Status: DC | PRN
Start: 2022-01-05 — End: 2022-01-20

## 2022-01-20 ENCOUNTER — Encounter: Payer: Self-pay | Admitting: Family Medicine

## 2022-01-20 ENCOUNTER — Ambulatory Visit: Payer: BC Managed Care – PPO | Admitting: Family Medicine

## 2022-01-20 VITALS — BP 124/82 | HR 66 | Temp 97.8°F | Wt 216.1 lb

## 2022-01-20 DIAGNOSIS — F411 Generalized anxiety disorder: Secondary | ICD-10-CM

## 2022-01-20 DIAGNOSIS — I10 Essential (primary) hypertension: Secondary | ICD-10-CM

## 2022-01-20 MED ORDER — SERTRALINE HCL 100 MG PO TABS: 100.0000 mg | ORAL_TABLET | Freq: Every day | ORAL | 1 refills | Status: DC

## 2022-01-20 MED ORDER — LORAZEPAM 0.5 MG PO TABS: 0.5000 mg | ORAL_TABLET | Freq: Two times a day (BID) | ORAL | 0 refills | Status: DC | PRN

## 2022-01-20 MED ORDER — LISINOPRIL 10 MG PO TABS
10.0000 mg | ORAL_TABLET | Freq: Every day | ORAL | 1 refills | Status: DC
Start: 2022-01-20 — End: 2022-11-04

## 2022-01-20 NOTE — Assessment & Plan Note (Signed)
Under good control on current regimen. Continue current regimen. Continue to monitor. Call with any concerns. Refills given. Labs drawn today.   

## 2022-01-20 NOTE — Progress Notes (Signed)
BP 124/82   Pulse 66   Temp 97.8 F (36.6 C)   Wt 216 lb 1.6 oz (98 kg)   SpO2 96%   BMI 27.01 kg/m    Subjective:    Patient ID: Shawn Andrade, male    DOB: Oct 11, 1975, 46 y.o.   MRN: [id number]  HPI: Shawn Andrade is a 46 y.o. male  Chief Complaint  Patient presents with   Hypertension   Anxiety   HYPERTENSION Hypertension status: controlled  Satisfied with current treatment? yes Duration of hypertension: chronic BP monitoring frequency:  not checking BP medication side effects:  no Medication compliance: excellent compliance Previous BP meds:lisinopril Aspirin: no Recurrent headaches: no Visual changes: no Palpitations: no Dyspnea: no Chest pain: no Lower extremity edema: no Dizzy/lightheaded: no  ANXIETY/STRESS Duration:stable Anxious mood: yes  Excessive worrying: no Irritability: no  Sweating: no Nausea: no Palpitations:no Hyperventilation: no Panic attacks: no Agoraphobia: no  Obscessions/compulsions: no Depressed mood: no    01/20/2022    4:04 PM 08/07/2021    3:16 PM 06/06/2021    1:03 PM 01/09/2021   10:10 AM 08/13/2020   10:13 AM  Depression screen PHQ 2/9  Decreased Interest 1 0 1 1 0  Down, Depressed, Hopeless 0 0 0 0 0  PHQ - 2 Score 1 0 1 1 0  Altered sleeping 0 0 1 2 2   Tired, decreased energy 0 0 0 3 2  Change in appetite 0 0 0 0 0  Feeling bad or failure about yourself  0 0 0 0 0  Trouble concentrating 0 0 0 0 0  Moving slowly or fidgety/restless 0 0 0 0 0  Suicidal thoughts 0 0 0 0 0  PHQ-9 Score 1 0 2 6 4   Difficult doing work/chores Not difficult at all Not difficult at all  Not difficult at all Not difficult at all      01/20/2022    4:04 PM 08/07/2021    3:16 PM 06/06/2021    1:04 PM 01/09/2021   10:12 AM  GAD 7 : Generalized Anxiety Score  Nervous, Anxious, on Edge 1 0 1 3  Control/stop worrying 1 0 1 3  Worry too much - different things 1 1 1 2   Trouble relaxing 0 0 1 1  Restless 0 0 1 0  Easily annoyed or irritable  0 0 1 1  Afraid - awful might happen 0 0 0 3  Total GAD 7 Score 3 1 6 13   Anxiety Difficulty  Not difficult at all Somewhat difficult Not difficult at all   Anhedonia: no Weight changes: no Insomnia: no   Hypersomnia: no Fatigue/loss of energy: no Feelings of worthlessness: no Feelings of guilt: no Impaired concentration/indecisiveness: no Suicidal ideations: no  Crying spells: no Recent Stressors/Life Changes: no   Relationship problems: no   Family stress: no     Financial stress: yes    Job stress: yes    Recent death/loss: no  Relevant past medical, surgical, family and social history reviewed and updated as indicated. Interim medical history since our last visit reviewed. Allergies and medications reviewed and updated.  Review of Systems  Constitutional: Negative.   Respiratory: Negative.    Cardiovascular: Negative.   Gastrointestinal: Negative.   Musculoskeletal: Negative.   Neurological: Negative.   Psychiatric/Behavioral: Negative.      Per HPI unless specifically indicated above     Objective:    BP 124/82   Pulse 66   Temp 97.8 F (36.6  C)   Wt 216 lb 1.6 oz (98 kg)   SpO2 96%   BMI 27.01 kg/m   Wt Readings from Last 3 Encounters:  01/20/22 216 lb 1.6 oz (98 kg)  08/07/21 212 lb 12.8 oz (96.5 kg)  06/06/21 212 lb 9.6 oz (96.4 kg)    Physical Exam Vitals and nursing note reviewed.  Constitutional:      General: He is not in acute distress.    Appearance: Normal appearance. He is normal weight. He is not ill-appearing, toxic-appearing or diaphoretic.  HENT:     Head: Normocephalic and atraumatic.     Right Ear: External ear normal.     Left Ear: External ear normal.     Nose: Nose normal.     Mouth/Throat:     Mouth: Mucous membranes are moist.     Pharynx: Oropharynx is clear.  Eyes:     General: No scleral icterus.       Right eye: No discharge.        Left eye: No discharge.     Extraocular Movements: Extraocular movements intact.      Conjunctiva/sclera: Conjunctivae normal.     Pupils: Pupils are equal, round, and reactive to light.  Cardiovascular:     Rate and Rhythm: Normal rate and regular rhythm.     Pulses: Normal pulses.     Heart sounds: Normal heart sounds. No murmur heard.    No friction rub. No gallop.  Pulmonary:     Effort: Pulmonary effort is normal. No respiratory distress.     Breath sounds: Normal breath sounds. No stridor. No wheezing, rhonchi or rales.  Chest:     Chest wall: No tenderness.  Musculoskeletal:        General: Normal range of motion.     Cervical back: Normal range of motion and neck supple.  Skin:    General: Skin is warm and dry.     Capillary Refill: Capillary refill takes less than 2 seconds.     Coloration: Skin is not jaundiced or pale.     Findings: No bruising, erythema, lesion or rash.  Neurological:     General: No focal deficit present.     Mental Status: He is alert and oriented to person, place, and time. Mental status is at baseline.  Psychiatric:        Mood and Affect: Mood normal.        Behavior: Behavior normal.        Thought Content: Thought content normal.        Judgment: Judgment normal.     Results for orders placed or performed in visit on 83/66/29  Basic metabolic panel  Result Value Ref Range   Glucose 120 (H) 70 - 99 mg/dL   BUN 14 6 - 24 mg/dL   Creatinine, Ser 0.75 (L) 0.76 - 1.27 mg/dL   eGFR 113 >59 mL/min/1.73   BUN/Creatinine Ratio 19 9 - 20   Sodium 136 134 - 144 mmol/L   Potassium 3.8 3.5 - 5.2 mmol/L   Chloride 97 96 - 106 mmol/L   CO2 19 (L) 20 - 29 mmol/L   Calcium 9.5 8.7 - 10.2 mg/dL      Assessment & Plan:   Problem List Items Addressed This Visit       Cardiovascular and Mediastinum   Primary hypertension - Primary    Under good control on current regimen. Continue current regimen. Continue to monitor. Call with any concerns. Refills given. Labs  drawn today.        Relevant Medications   lisinopril (ZESTRIL) 10  MG tablet   Other Relevant Orders   Basic metabolic panel     Other   Anxiety disorder    Under good control on current regimen. Continue current regimen. Continue to monitor. Call with any concerns. Refills given. Lorazepam Rx should last about 6 months.        Relevant Medications   sertraline (ZOLOFT) 100 MG tablet   LORazepam (ATIVAN) 0.5 MG tablet     Follow up plan: Return in about 6 months (around 07/23/2022) for physical.

## 2022-01-20 NOTE — Assessment & Plan Note (Signed)
Under good control on current regimen. Continue current regimen. Continue to monitor. Call with any concerns. Refills given. Lorazepam Rx should last about 6 months.

## 2022-01-21 LAB — BASIC METABOLIC PANEL
BUN/Creatinine Ratio: 19 (ref 9–20)
BUN: 17 mg/dL (ref 6–24)
CO2: 20 mmol/L (ref 20–29)
Calcium: 9.3 mg/dL (ref 8.7–10.2)
Chloride: 104 mmol/L (ref 96–106)
Creatinine, Ser: 0.88 mg/dL (ref 0.76–1.27)
Glucose: 95 mg/dL (ref 70–99)
Potassium: 3.9 mmol/L (ref 3.5–5.2)
Sodium: 136 mmol/L (ref 134–144)
eGFR: 107 mL/min/{1.73_m2} (ref >59)

## 2022-04-27 ENCOUNTER — Other Ambulatory Visit: Payer: Self-pay | Admitting: Family Medicine

## 2022-05-07 NOTE — Telephone Encounter (Signed)
appt

## 2022-05-08 NOTE — Telephone Encounter (Signed)
Called patient to schedule appointment and let him know that in order to get the refill he needed to be seen.  PT stated that he was moving and had put his meds in some boxes, he ended up finding them later.  Stated that he thought he had enough until his 6 month appointment in February.

## 2022-07-23 ENCOUNTER — Encounter: Payer: BC Managed Care – PPO | Admitting: Family Medicine

## 2022-11-04 ENCOUNTER — Other Ambulatory Visit: Payer: Self-pay | Admitting: Family Medicine

## 2022-11-04 NOTE — Telephone Encounter (Signed)
Patient needs OV for additional refills.  Requested Prescriptions  Pending Prescriptions Disp Refills   sertraline (ZOLOFT) 100 MG tablet [Pharmacy Med Name: SERTRALINE HCL 100 MG TABLET] 90 tablet 0    Sig: TAKE 1 TABLET BY MOUTH EVERY DAY     Psychiatry:  Antidepressants - SSRI - sertraline Failed - 11/04/2022  2:36 AM      Failed - AST in normal range and within 360 days    AST  Date Value Ref Range Status  06/06/2021 19 0 - 40 IU/L Final         Failed - ALT in normal range and within 360 days    ALT  Date Value Ref Range Status  06/06/2021 23 0 - 44 IU/L Final         Failed - Valid encounter within last 6 months    Recent Outpatient Visits           9 months ago Primary hypertension   [facility] [place], [place], DO   1 year ago Primary hypertension   [place] [facility] [place], Megan P, DO   1 year ago Routine general medical examination at a health care facility   [place], [place] P, DO   1 year ago Generalized anxiety disorder   [facility] [place], [place] P, DO   2 years ago TMJ (temporomandibular joint disorder)   [place] [facility] [place], Megan P, DO              Passed - Completed PHQ-2 or PHQ-9 in the last 360 days       lisinopril (ZESTRIL) 10 MG tablet [Pharmacy Med Name: LISINOPRIL 10 MG TABLET] 90 tablet 0    Sig: TAKE 1 TABLET BY MOUTH EVERY DAY     Cardiovascular:  ACE Inhibitors Failed - 11/04/2022  2:36 AM      Failed - Cr in normal range and within 180 days    Creatinine, Ser  Date Value Ref Range Status  01/20/2022 0.88 0.76 - 1.27 mg/dL Final         Failed - K in normal range and within 180 days    Potassium  Date Value Ref Range Status  01/20/2022 3.9 3.5 - 5.2 mmol/L Final         Failed - Valid encounter within last 6 months    Recent Outpatient Visits           9 months ago Primary hypertension   [facility] [place], [place], DO   1 year ago Primary hypertension   [facility] [place], [place] P, DO   1 year ago Routine general medical examination at a health care facility   [facility] [place], [place] P, DO   1 year ago Generalized anxiety disorder   [facility] [place], [place] P, DO   2 years ago TMJ (temporomandibular joint disorder)   [place] [facility] [place], [place], [place]              Passed - Patient is not pregnant      Passed - Last BP in normal range    BP Readings from Last 1 Encounters:  01/20/22 124/82

## 2023-01-28 ENCOUNTER — Ambulatory Visit (INDEPENDENT_AMBULATORY_CARE_PROVIDER_SITE_OTHER): Payer: BC Managed Care – PPO | Admitting: Family Medicine

## 2023-01-28 ENCOUNTER — Encounter: Payer: Self-pay | Admitting: Family Medicine

## 2023-01-28 VITALS — BP 150/94 | HR 68 | Temp 98.1°F | Ht 75.0 in | Wt 224.2 lb

## 2023-01-28 DIAGNOSIS — Z0000 Encounter for general adult medical examination without abnormal findings: Secondary | ICD-10-CM

## 2023-01-28 DIAGNOSIS — I10 Essential (primary) hypertension: Secondary | ICD-10-CM

## 2023-01-28 DIAGNOSIS — Z1211 Encounter for screening for malignant neoplasm of colon: Secondary | ICD-10-CM | POA: Diagnosis not present

## 2023-01-28 DIAGNOSIS — F411 Generalized anxiety disorder: Secondary | ICD-10-CM | POA: Diagnosis not present

## 2023-01-28 LAB — URINALYSIS, ROUTINE W REFLEX MICROSCOPIC
Bilirubin, UA: NEGATIVE
Glucose, UA: NEGATIVE
Ketones, UA: NEGATIVE
Leukocytes,UA: NEGATIVE
Nitrite, UA: NEGATIVE
Protein,UA: NEGATIVE
RBC, UA: NEGATIVE
Specific Gravity, UA: >1.03 — ABNORMAL HIGH (ref 1.005–1.030)
Urobilinogen, Ur: 0.2 mg/dL (ref 0.2–1.0)
pH, UA: 5.5 (ref 5.0–7.5)

## 2023-01-28 LAB — MICROALBUMIN, URINE WAIVED
Creatinine, Urine Waived: 300 mg/dL (ref 10–300)
Microalb, Ur Waived: 30 mg/L — ABNORMAL HIGH (ref 0–19)
Microalb/Creat Ratio: <30 mg/g (ref <30)

## 2023-01-28 MED ORDER — LISINOPRIL 10 MG PO TABS: 10.0000 mg | ORAL_TABLET | Freq: Every day | ORAL | 1 refills | Status: DC

## 2023-01-28 MED ORDER — SERTRALINE HCL 100 MG PO TABS: 100.0000 mg | ORAL_TABLET | Freq: Every day | ORAL | 1 refills | Status: DC

## 2023-01-28 NOTE — Progress Notes (Signed)
BP (!) 150/94   Pulse 68   Temp 98.1 F (36.7 C) (Oral)   Ht 6\' 3"  (1.905 m)   Wt 224 lb 3.2 oz (101.7 kg)   SpO2 97%   BMI 28.02 kg/m    Subjective:    Patient ID: Shawn Andrade, male    DOB: January 30, 1976, 47 y.o.   MRN: [id number]  HPI: Shawn Andrade is a 47 y.o. male presenting on 01/28/2023 for comprehensive medical examination. Current medical complaints include:  HYPERTENSION- has not been taking his medicine  Hypertension status: uncontrolled  Satisfied with current treatment? yes Duration of hypertension: chronic BP monitoring frequency:  not checking BP range:  BP medication side effects:  no Medication compliance: poor compliance Previous BP meds:lisinopril Aspirin: no Recurrent headaches: no Visual changes: no Palpitations: no Dyspnea: no Chest pain: no Lower extremity edema: no Dizzy/lightheaded: no  ANXIETY/DEPRESSION Duration: chronic Status:better Anxious mood: no  Excessive worrying: no Irritability: no  Sweating: no Nausea: no Palpitations:no Hyperventilation: no Panic attacks: no Agoraphobia: no  Obscessions/compulsions: no Depressed mood: no    01/28/2023   10:12 AM 01/20/2022    4:04 PM 08/07/2021    3:16 PM 06/06/2021    1:03 PM 01/09/2021   10:10 AM  Depression screen PHQ 2/9  Decreased Interest 0 1 0 1 1  Down, Depressed, Hopeless 0 0 0 0 0  PHQ - 2 Score 0 1 0 1 1  Altered sleeping 0 0 0 1 2  Tired, decreased energy 1 0 0 0 3  Change in appetite 1 0 0 0 0  Feeling bad or failure about yourself  0 0 0 0 0  Trouble concentrating 0 0 0 0 0  Moving slowly or fidgety/restless 0 0 0 0 0  Suicidal thoughts 0 0 0 0 0  PHQ-9 Score 2 1 0 2 6  Difficult doing work/chores Not difficult at all Not difficult at all Not difficult at all  Not difficult at all      01/28/2023   10:12 AM 01/20/2022    4:04 PM 08/07/2021    3:16 PM 06/06/2021    1:04 PM  GAD 7 : Generalized Anxiety Score  Nervous, Anxious, on Edge 0 1 0 1  Control/stop  worrying 0 1 0 1  Worry too much - different things 0 1 1 1   Trouble relaxing 0 0 0 1  Restless 0 0 0 1  Easily annoyed or irritable 0 0 0 1  Afraid - awful might happen 0 0 0 0  Total GAD 7 Score 0 3 1 6   Anxiety Difficulty Not difficult at all  Not difficult at all Somewhat difficult   Anhedonia: no Weight changes: no Insomnia: no   Hypersomnia: no Fatigue/loss of energy: no Feelings of worthlessness: no Feelings of guilt: no Impaired concentration/indecisiveness: no Suicidal ideations: no  Crying spells: no Recent Stressors/Life Changes: no   Relationship problems: no   Family stress: no     Financial stress: no    Job stress: no    Recent death/loss: no  He currently lives with: wife and kids Interim Problems from his last visit: no  Depression Screen done today and results listed below:     01/28/2023   10:12 AM 01/20/2022    4:04 PM 08/07/2021    3:16 PM 06/06/2021    1:03 PM 01/09/2021   10:10 AM  Depression screen PHQ 2/9  Decreased Interest 0 1 0 1 1  Down, Depressed, Hopeless  0 0 0 0 0  PHQ - 2 Score 0 1 0 1 1  Altered sleeping 0 0 0 1 2  Tired, decreased energy 1 0 0 0 3  Change in appetite 1 0 0 0 0  Feeling bad or failure about yourself  0 0 0 0 0  Trouble concentrating 0 0 0 0 0  Moving slowly or fidgety/restless 0 0 0 0 0  Suicidal thoughts 0 0 0 0 0  PHQ-9 Score 2 1 0 2 6  Difficult doing work/chores Not difficult at all Not difficult at all Not difficult at all  Not difficult at all     Past Medical History:  Past Medical History:  Diagnosis Date   Allergy    Anxiety    Depression 2007   Bing treated   Elevated blood pressure    Hypertension 2023   IFG (impaired fasting glucose)     Surgical History:  Past Surgical History:  Procedure Laterality Date   NO PAST SURGERIES      Medications:  Current Outpatient Medications on File Prior to Visit  Medication Sig   LORazepam (ATIVAN) 0.5 MG tablet Take 1 tablet (0.5 mg total) by mouth 2  (two) times daily as needed for anxiety.   cyclobenzaprine (FLEXERIL) 10 MG tablet TAKE 1 TABLET BY MOUTH AT BEDTIME AS NEEDED FOR MUSCLE SPASMS. (Patient not taking: Reported on 01/28/2023)   No current facility-administered medications on file prior to visit.    Allergies:  No Known Allergies  Social History:  Social History   Socioeconomic History   Marital status: Married    Spouse name: Not on file   Number of children: 2   Years of education: Not on file   Highest education level: Not on file  Occupational History   Occupation: FIREFIGHTER    Employer: [employer]  Tobacco Use   Smoking status: Every Day    Types: E-cigarettes   Smokeless tobacco: Current    Types: Snuff  Vaping Use   Vaping status: Never Used  Substance and Sexual Activity   Alcohol use: Not Currently    Comment: one or less per day, rare   Drug use: No   Sexual activity: Yes  Other Topics Concern   Not on file  Social History Narrative   Not on file   Social Determinants of Health   Financial Resource Strain: Not on file  Food Insecurity: Not on file  Transportation Needs: Not on file  Physical Activity: Not on file  Stress: Not on file  Social Connections: Not on file  Intimate Partner Violence: Not on file   Social History   Tobacco Use  Smoking Status Every Day   Types: E-cigarettes  Smokeless Tobacco Current   Types: Snuff   Social History   Substance and Sexual Activity  Alcohol Use Not Currently   Comment: one or less per day, rare    Family History:  Family History  Problem Relation Age of Onset   Hypertension Father    Hypertension Mother    Anxiety disorder Mother    Cancer Paternal Grandfather     Past medical history, surgical history, medications, allergies, family history and social history reviewed with patient today and changes made to appropriate areas of the chart.   Review of Systems  Constitutional: Negative.   HENT: Negative.    Eyes: Negative.    Respiratory:  Positive for shortness of breath. Negative for cough, hemoptysis, sputum production and wheezing.  Cardiovascular: Negative.   Gastrointestinal:  Positive for heartburn. Negative for abdominal pain, blood in stool, constipation, diarrhea, melena, nausea and vomiting.  Genitourinary: Negative.   Musculoskeletal: Negative.   Skin: Negative.   Neurological: Negative.   Endo/Heme/Allergies:  Positive for environmental allergies. Negative for polydipsia. Does not bruise/bleed easily.  Psychiatric/Behavioral: Negative.     All other ROS negative except what is listed above and in the HPI.      Objective:    BP (!) 150/94   Pulse 68   Temp 98.1 F (36.7 C) (Oral)   Ht 6\' 3"  (1.905 m)   Wt 224 lb 3.2 oz (101.7 kg)   SpO2 97%   BMI 28.02 kg/m   Wt Readings from Last 3 Encounters:  01/28/23 224 lb 3.2 oz (101.7 kg)  01/20/22 216 lb 1.6 oz (98 kg)  08/07/21 212 lb 12.8 oz (96.5 kg)    Physical Exam Vitals and nursing note reviewed.  Constitutional:      General: He is not in acute distress.    Appearance: Normal appearance. He is not ill-appearing, toxic-appearing or diaphoretic.  HENT:     Head: Normocephalic and atraumatic.     Right Ear: Tympanic membrane, ear canal and external ear normal. There is no impacted cerumen.     Left Ear: Tympanic membrane, ear canal and external ear normal. There is no impacted cerumen.     Nose: Nose normal. No congestion or rhinorrhea.     Mouth/Throat:     Mouth: Mucous membranes are moist.     Pharynx: Oropharynx is clear. No oropharyngeal exudate or posterior oropharyngeal erythema.  Eyes:     General: No scleral icterus.       Right eye: No discharge.        Left eye: No discharge.     Extraocular Movements: Extraocular movements intact.     Conjunctiva/sclera: Conjunctivae normal.     Pupils: Pupils are equal, round, and reactive to light.  Neck:     Vascular: No carotid bruit.  Cardiovascular:     Rate and Rhythm:  Normal rate and regular rhythm.     Pulses: Normal pulses.     Heart sounds: No murmur heard.    No friction rub. No gallop.  Pulmonary:     Effort: Pulmonary effort is normal. No respiratory distress.     Breath sounds: Normal breath sounds. No stridor. No wheezing, rhonchi or rales.  Chest:     Chest wall: No tenderness.  Abdominal:     General: Abdomen is flat. Bowel sounds are normal. There is no distension.     Palpations: Abdomen is soft. There is no mass.     Tenderness: There is no abdominal tenderness. There is no right CVA tenderness, left CVA tenderness, guarding or rebound.     Hernia: No hernia is present.  Genitourinary:    Comments: Genital exam deferred with shared decision making Musculoskeletal:        General: No swelling, tenderness, deformity or signs of injury.     Cervical back: Normal range of motion and neck supple. No rigidity. No muscular tenderness.     Right lower leg: No edema.     Left lower leg: No edema.  Lymphadenopathy:     Cervical: No cervical adenopathy.  Skin:    General: Skin is warm and dry.     Capillary Refill: Capillary refill takes less than 2 seconds.     Coloration: Skin is not jaundiced or pale.  Findings: No bruising, erythema, lesion or rash.  Neurological:     General: No focal deficit present.     Mental Status: He is alert and oriented to person, place, and time.     Cranial Nerves: No cranial nerve deficit.     Sensory: No sensory deficit.     Motor: No weakness.     Coordination: Coordination normal.     Gait: Gait normal.     Deep Tendon Reflexes: Reflexes normal.  Psychiatric:        Mood and Affect: Mood normal.        Behavior: Behavior normal.        Thought Content: Thought content normal.        Judgment: Judgment normal.     Results for orders placed or performed in visit on 01/28/23  Urinalysis, Routine w reflex microscopic  Result Value Ref Range   Specific Gravity, UA >1.030 (H) 1.005 - 1.030   pH,  UA 5.5 5.0 - 7.5   Color, UA Yellow Yellow   Appearance Ur Cloudy (A) Clear   Leukocytes,UA Negative Negative   Protein,UA Negative Negative/Trace   Glucose, UA Negative Negative   Ketones, UA Negative Negative   RBC, UA Negative Negative   Bilirubin, UA Negative Negative   Urobilinogen, Ur 0.2 0.2 - 1.0 mg/dL   Nitrite, UA Negative Negative   Microscopic Examination Comment   Microalbumin, Urine Waived  Result Value Ref Range   Microalb, Ur Waived 30 (H) 0 - 19 mg/L   Creatinine, Urine Waived 300 10 - 300 mg/dL   Microalb/Creat Ratio <30 <30 mg/g      Assessment & Plan:   Problem List Items Addressed This Visit       Cardiovascular and Mediastinum   Primary hypertension    Not under good control. Has not been taking his medicine. Encouraged him to take his medication daily. Recheck 3 months. Call with any concerns.       Relevant Medications   lisinopril (ZESTRIL) 10 MG tablet   Other Relevant Orders   Microalbumin, Urine Waived (Completed)     Other   Anxiety disorder    Under good control on current regimen. Continue current regimen. Continue to monitor. Call with any concerns. Refills given.        Relevant Medications   sertraline (ZOLOFT) 100 MG tablet   Other Visit Diagnoses     Routine general medical examination at a health care facility    -  Primary   Vaccines up to date. Screening labs checked today. Cologuard ordered. Continue diet and exercise. Call with any concerns.   Relevant Orders   Comprehensive metabolic panel   CBC with Differential/Platelet   Lipid Panel w/o Chol/HDL Ratio   PSA   TSH   Urinalysis, Routine w reflex microscopic (Completed)   Screening for colon cancer       Cologuard ordered today.   Relevant Orders   Cologuard        Discussed aspirin prophylaxis for myocardial infarction prevention and decision was it was not indicated  LABORATORY TESTING:  Health maintenance labs ordered today as discussed above.   The  natural history of prostate cancer and ongoing controversy regarding screening and potential treatment outcomes of prostate cancer has been discussed with the patient. The meaning of a false positive PSA and a false negative PSA has been discussed. He indicates understanding of the limitations of this screening test and wishes to proceed with screening PSA testing.  IMMUNIZATIONS:   - Tdap: Tetanus vaccination status reviewed: last tetanus booster within 10 years. - Influenza: Refused - Pneumovax: Not applicable - Prevnar: Not applicable - COVID: Refused - HPV: Not applicable - Shingrix vaccine: Not applicable  SCREENING: - Colonoscopy: cologuard ordered today  Discussed with patient purpose of the colonoscopy is to detect colon cancer at curable precancerous or early stages   PATIENT COUNSELING:    Sexuality: Discussed sexually transmitted diseases, partner selection, use of condoms, avoidance of unintended pregnancy  and contraceptive alternatives.   Advised to avoid cigarette smoking.  I discussed with the patient that most people either abstain from alcohol or drink within safe limits (<=14/week and <=4 drinks/occasion for males, <=7/weeks and <= 3 drinks/occasion for females) and that the risk for alcohol disorders and other health effects rises proportionally with the number of drinks per week and how often a drinker exceeds daily limits.  Discussed cessation/primary prevention of drug use and availability of treatment for abuse.   Diet: Encouraged to adjust caloric intake to maintain  or achieve ideal body weight, to reduce intake of dietary saturated fat and total fat, to limit sodium intake by avoiding high sodium foods and not adding table salt, and to maintain adequate dietary potassium and calcium preferably from fresh fruits, vegetables, and low-fat dairy products.    stressed the importance of regular exercise  Injury prevention: Discussed safety belts, safety helmets,  smoke detector, smoking near bedding or upholstery.   Dental health: Discussed importance of regular tooth brushing, flossing, and dental visits.   Follow up plan: NEXT PREVENTATIVE PHYSICAL DUE IN 1 YEAR. Return in about 3 months (around 04/30/2023).

## 2023-01-28 NOTE — Assessment & Plan Note (Signed)
Under good control on current regimen. Continue current regimen. Continue to monitor. Call with any concerns. Refills given.   

## 2023-01-28 NOTE — Assessment & Plan Note (Signed)
Not under good control. Has not been taking his medicine. Encouraged him to take his medication daily. Recheck 3 months. Call with any concerns.

## 2023-01-29 LAB — LIPID PANEL W/O CHOL/HDL RATIO
Cholesterol, Total: 225 mg/dL — ABNORMAL HIGH (ref 100–199)
HDL: 48 mg/dL (ref >39)
LDL Chol Calc (NIH): 156 mg/dL — ABNORMAL HIGH (ref 0–99)
Triglycerides: 119 mg/dL (ref 0–149)
VLDL Cholesterol Cal: 21 mg/dL (ref 5–40)

## 2023-01-29 LAB — CBC WITH DIFFERENTIAL/PLATELET
Basophils Absolute: 0 10*3/uL (ref 0.0–0.2)
Basos: 1 %
EOS (ABSOLUTE): 0.3 10*3/uL (ref 0.0–0.4)
Eos: 5 %
Hematocrit: 43.8 % (ref 37.5–51.0)
Hemoglobin: 14.7 g/dL (ref 13.0–17.7)
Immature Grans (Abs): 0 10*3/uL (ref 0.0–0.1)
Immature Granulocytes: 0 %
Lymphocytes Absolute: 1.7 10*3/uL (ref 0.7–3.1)
Lymphs: 29 %
MCH: 28.8 pg (ref 26.6–33.0)
MCHC: 33.6 g/dL (ref 31.5–35.7)
MCV: 86 fL (ref 79–97)
Monocytes Absolute: 0.4 10*3/uL (ref 0.1–0.9)
Monocytes: 7 %
Neutrophils Absolute: 3.5 10*3/uL (ref 1.4–7.0)
Neutrophils: 58 %
Platelets: 278 10*3/uL (ref 150–450)
RBC: 5.1 x10E6/uL (ref 4.14–5.80)
RDW: 13.1 % (ref 11.6–15.4)
WBC: 6 10*3/uL (ref 3.4–10.8)

## 2023-01-29 LAB — COMPREHENSIVE METABOLIC PANEL
ALT: 27 IU/L (ref 0–44)
AST: 19 IU/L (ref 0–40)
Albumin: 4.6 g/dL (ref 4.1–5.1)
Alkaline Phosphatase: 91 IU/L (ref 44–121)
BUN/Creatinine Ratio: 19 (ref 9–20)
BUN: 18 mg/dL (ref 6–24)
Bilirubin Total: 0.4 mg/dL (ref 0.0–1.2)
CO2: 25 mmol/L (ref 20–29)
Calcium: 9.8 mg/dL (ref 8.7–10.2)
Chloride: 105 mmol/L (ref 96–106)
Creatinine, Ser: 0.93 mg/dL (ref 0.76–1.27)
Globulin, Total: 2.7 g/dL (ref 1.5–4.5)
Glucose: 93 mg/dL (ref 70–99)
Potassium: 4.8 mmol/L (ref 3.5–5.2)
Sodium: 144 mmol/L (ref 134–144)
Total Protein: 7.3 g/dL (ref 6.0–8.5)
eGFR: 101 mL/min/{1.73_m2} (ref >59)

## 2023-01-29 LAB — PSA: Prostate Specific Ag, Serum: 0.8 ng/mL (ref 0.0–4.0)

## 2023-01-29 LAB — TSH: TSH: 0.753 u[IU]/mL (ref 0.450–4.500)

## 2023-02-17 ENCOUNTER — Other Ambulatory Visit: Payer: Self-pay | Admitting: Family Medicine

## 2023-02-22 MED ORDER — LORAZEPAM 0.5 MG PO TABS: 0.5000 mg | ORAL_TABLET | Freq: Two times a day (BID) | ORAL | 0 refills | Status: AC | PRN

## 2023-04-05 ENCOUNTER — Encounter: Payer: Self-pay | Admitting: Family Medicine

## 2023-04-30 ENCOUNTER — Ambulatory Visit: Payer: BC Managed Care – PPO | Admitting: Family Medicine

## 2023-05-04 ENCOUNTER — Ambulatory Visit: Payer: BC Managed Care – PPO | Admitting: Family Medicine

## 2023-05-04 VITALS — BP 115/67 | HR 70 | Temp 98.9°F | Resp 18 | Wt 220.2 lb

## 2023-05-04 DIAGNOSIS — F411 Generalized anxiety disorder: Secondary | ICD-10-CM

## 2023-05-04 DIAGNOSIS — I10 Essential (primary) hypertension: Secondary | ICD-10-CM | POA: Diagnosis not present

## 2023-05-04 DIAGNOSIS — Z1211 Encounter for screening for malignant neoplasm of colon: Secondary | ICD-10-CM

## 2023-05-04 MED ORDER — LISINOPRIL 10 MG PO TABS: 10.0000 mg | ORAL_TABLET | Freq: Every day | ORAL | 0 refills | Status: DC

## 2023-05-04 MED ORDER — SERTRALINE HCL 100 MG PO TABS: 100.0000 mg | ORAL_TABLET | Freq: Every day | ORAL | 0 refills | Status: DC

## 2023-05-04 NOTE — Assessment & Plan Note (Signed)
Under good control on current regimen. Continue current regimen. Continue to monitor. Call with any concerns. Refills given. Labs drawn today.   

## 2023-05-04 NOTE — Progress Notes (Signed)
BP 115/67 (BP Location: Left Arm, Patient Position: Sitting, Cuff Size: Large)   Pulse 70   Temp 98.9 F (37.2 C) (Oral)   Resp 18   Wt 220 lb 3.2 oz (99.9 kg)   SpO2 98%   BMI 27.52 kg/m    Subjective:    Patient ID: Shawn Andrade, male    DOB: 07-16-1975, 47 y.o.   MRN: [id number]  HPI: Shawn Andrade is a 47 y.o. male  Chief Complaint  Patient presents with   Hypertension   Anxiety    Same   Depression    Same   HYPERTENSION  Hypertension status: controlled  Satisfied with current treatment? yes Duration of hypertension: chronic BP monitoring frequency:  rarely BP medication side effects:  no Medication compliance: excellent compliance Previous BP meds: lisinopril Aspirin: no Recurrent headaches: no Visual changes: no Palpitations: no Dyspnea: no Chest pain: no Lower extremity edema: no Dizzy/lightheaded: no  ANXIETY/STRESS- has not needed his lorazepam in a while. Likes to keep it there if needed.  Duration: chronic Status:controlled Anxious mood: no  Excessive worrying: no Irritability: no  Sweating: no Nausea: no Palpitations:no Hyperventilation: no Panic attacks: no Agoraphobia: no  Obscessions/compulsions: no Depressed mood: no    01/28/2023   10:12 AM 01/20/2022    4:04 PM 08/07/2021    3:16 PM 06/06/2021    1:03 PM 01/09/2021   10:10 AM  Depression screen PHQ 2/9  Decreased Interest 0 1 0 1 1  Down, Depressed, Hopeless 0 0 0 0 0  PHQ - 2 Score 0 1 0 1 1  Altered sleeping 0 0 0 1 2  Tired, decreased energy 1 0 0 0 3  Change in appetite 1 0 0 0 0  Feeling bad or failure about yourself  0 0 0 0 0  Trouble concentrating 0 0 0 0 0  Moving slowly or fidgety/restless 0 0 0 0 0  Suicidal thoughts 0 0 0 0 0  PHQ-9 Score 2 1 0 2 6  Difficult doing work/chores Not difficult at all Not difficult at all Not difficult at all  Not difficult at all      01/28/2023   10:12 AM 01/20/2022    4:04 PM 08/07/2021    3:16 PM 06/06/2021    1:04 PM  GAD 7  : Generalized Anxiety Score  Nervous, Anxious, on Edge 0 1 0 1  Control/stop worrying 0 1 0 1  Worry too much - different things 0 1 1 1   Trouble relaxing 0 0 0 1  Restless 0 0 0 1  Easily annoyed or irritable 0 0 0 1  Afraid - awful might happen 0 0 0 0  Total GAD 7 Score 0 3 1 6   Anxiety Difficulty Not difficult at all  Not difficult at all Somewhat difficult   Anhedonia: no Weight changes: no Insomnia: no   Hypersomnia: no Fatigue/loss of energy: no Feelings of worthlessness: no Feelings of guilt: no Impaired concentration/indecisiveness: no Suicidal ideations: no  Crying spells: no Recent Stressors/Life Changes: no   Relationship problems: no   Family stress: no     Financial stress: no    Job stress: no    Recent death/loss: no  Relevant past medical, surgical, family and social history reviewed and updated as indicated. Interim medical history since our last visit reviewed. Allergies and medications reviewed and updated.  Review of Systems  Constitutional: Negative.   Respiratory: Negative.    Cardiovascular: Negative.  Musculoskeletal: Negative.   Psychiatric/Behavioral: Negative.      Per HPI unless specifically indicated above     Objective:    BP 115/67 (BP Location: Left Arm, Patient Position: Sitting, Cuff Size: Large)   Pulse 70   Temp 98.9 F (37.2 C) (Oral)   Resp 18   Wt 220 lb 3.2 oz (99.9 kg)   SpO2 98%   BMI 27.52 kg/m   Wt Readings from Last 3 Encounters:  05/04/23 220 lb 3.2 oz (99.9 kg)  01/28/23 224 lb 3.2 oz (101.7 kg)  01/20/22 216 lb 1.6 oz (98 kg)    Physical Exam Vitals and nursing note reviewed.  Constitutional:      General: He is not in acute distress.    Appearance: Normal appearance. He is not ill-appearing, toxic-appearing or diaphoretic.  HENT:     Head: Normocephalic and atraumatic.     Right Ear: External ear normal.     Left Ear: External ear normal.     Nose: Nose normal.     Mouth/Throat:     Mouth: Mucous  membranes are moist.     Pharynx: Oropharynx is clear.  Eyes:     General: No scleral icterus.       Right eye: No discharge.        Left eye: No discharge.     Extraocular Movements: Extraocular movements intact.     Conjunctiva/sclera: Conjunctivae normal.     Pupils: Pupils are equal, round, and reactive to light.  Cardiovascular:     Rate and Rhythm: Normal rate and regular rhythm.     Pulses: Normal pulses.     Heart sounds: Normal heart sounds. No murmur heard.    No friction rub. No gallop.  Pulmonary:     Effort: Pulmonary effort is normal. No respiratory distress.     Breath sounds: Normal breath sounds. No stridor. No wheezing, rhonchi or rales.  Chest:     Chest wall: No tenderness.  Musculoskeletal:        General: Normal range of motion.     Cervical back: Normal range of motion and neck supple.  Skin:    General: Skin is warm and dry.     Capillary Refill: Capillary refill takes less than 2 seconds.     Coloration: Skin is not jaundiced or pale.     Findings: No bruising, erythema, lesion or rash.  Neurological:     General: No focal deficit present.     Mental Status: He is alert and oriented to person, place, and time. Mental status is at baseline.  Psychiatric:        Mood and Affect: Mood normal.        Behavior: Behavior normal.        Thought Content: Thought content normal.        Judgment: Judgment normal.     Results for orders placed or performed in visit on 01/28/23  Comprehensive metabolic panel  Result Value Ref Range   Glucose 93 70 - 99 mg/dL   BUN 18 6 - 24 mg/dL   Creatinine, Ser 6.57 0.76 - 1.27 mg/dL   eGFR 846 >96 EX/BMW/4.13   BUN/Creatinine Ratio 19 9 - 20   Sodium 144 134 - 144 mmol/L   Potassium 4.8 3.5 - 5.2 mmol/L   Chloride 105 96 - 106 mmol/L   CO2 25 20 - 29 mmol/L   Calcium 9.8 8.7 - 10.2 mg/dL   Total Protein 7.3 6.0 - 8.5  g/dL   Albumin 4.6 4.1 - 5.1 g/dL   Globulin, Total 2.7 1.5 - 4.5 g/dL   Bilirubin Total 0.4 0.0  - 1.2 mg/dL   Alkaline Phosphatase 91 44 - 121 IU/L   AST 19 0 - 40 IU/L   ALT 27 0 - 44 IU/L  CBC with Differential/Platelet  Result Value Ref Range   WBC 6.0 3.4 - 10.8 x10E3/uL   RBC 5.10 4.14 - 5.80 x10E6/uL   Hemoglobin 14.7 13.0 - 17.7 g/dL   Hematocrit 40.9 81.1 - 51.0 %   MCV 86 79 - 97 fL   MCH 28.8 26.6 - 33.0 pg   MCHC 33.6 31.5 - 35.7 g/dL   RDW 91.4 78.2 - 95.6 %   Platelets 278 150 - 450 x10E3/uL   Neutrophils 58 Not Estab. %   Lymphs 29 Not Estab. %   Monocytes 7 Not Estab. %   Eos 5 Not Estab. %   Basos 1 Not Estab. %   Neutrophils Absolute 3.5 1.4 - 7.0 x10E3/uL   Lymphocytes Absolute 1.7 0.7 - 3.1 x10E3/uL   Monocytes Absolute 0.4 0.1 - 0.9 x10E3/uL   EOS (ABSOLUTE) 0.3 0.0 - 0.4 x10E3/uL   Basophils Absolute 0.0 0.0 - 0.2 x10E3/uL   Immature Granulocytes 0 Not Estab. %   Immature Grans (Abs) 0.0 0.0 - 0.1 x10E3/uL  Lipid Panel w/o Chol/HDL Ratio  Result Value Ref Range   Cholesterol, Total 225 (H) 100 - 199 mg/dL   Triglycerides 213 0 - 149 mg/dL   HDL 48 >08 mg/dL   VLDL Cholesterol Cal 21 5 - 40 mg/dL   LDL Chol Calc (NIH) 657 (H) 0 - 99 mg/dL  PSA  Result Value Ref Range   Prostate Specific Ag, Serum 0.8 0.0 - 4.0 ng/mL  TSH  Result Value Ref Range   TSH 0.753 0.450 - 4.500 uIU/mL  Urinalysis, Routine w reflex microscopic  Result Value Ref Range   Specific Gravity, UA >1.030 (H) 1.005 - 1.030   pH, UA 5.5 5.0 - 7.5   Color, UA Yellow Yellow   Appearance Ur Cloudy (A) Clear   Leukocytes,UA Negative Negative   Protein,UA Negative Negative/Trace   Glucose, UA Negative Negative   Ketones, UA Negative Negative   RBC, UA Negative Negative   Bilirubin, UA Negative Negative   Urobilinogen, Ur 0.2 0.2 - 1.0 mg/dL   Nitrite, UA Negative Negative   Microscopic Examination Comment   Microalbumin, Urine Waived  Result Value Ref Range   Microalb, Ur Waived 30 (H) 0 - 19 mg/L   Creatinine, Urine Waived 300 10 - 300 mg/dL   Microalb/Creat Ratio <30 <30  mg/g      Assessment & Plan:   Problem List Items Addressed This Visit       Cardiovascular and Mediastinum   Primary hypertension - Primary    Under good control on current regimen. Continue current regimen. Continue to monitor. Call with any concerns. Refills given. Labs drawn today.        Relevant Medications   lisinopril (ZESTRIL) 10 MG tablet   Other Relevant Orders   Basic metabolic panel     Other   Anxiety disorder    Under good control on current regimen. Continue current regimen. Continue to monitor. Call with any concerns. Refills given. Labs drawn today.         Relevant Medications   sertraline (ZOLOFT) 100 MG tablet   Other Visit Diagnoses     Screening for colon  cancer       Referral to GI placed today.   Relevant Orders   Ambulatory referral to Gastroenterology        Follow up plan: Return in about 6 months (around 11/01/2023).

## 2023-05-05 LAB — BASIC METABOLIC PANEL
BUN/Creatinine Ratio: 19 (ref 9–20)
BUN: 18 mg/dL (ref 6–24)
CO2: 22 mmol/L (ref 20–29)
Calcium: 9.2 mg/dL (ref 8.7–10.2)
Chloride: 105 mmol/L (ref 96–106)
Creatinine, Ser: 0.95 mg/dL (ref 0.76–1.27)
Glucose: 96 mg/dL (ref 70–99)
Potassium: 3.9 mmol/L (ref 3.5–5.2)
Sodium: 142 mmol/L (ref 134–144)
eGFR: 99 mL/min/{1.73_m2} (ref >59)

## 2023-08-26 ENCOUNTER — Telehealth

## 2023-10-30 ENCOUNTER — Other Ambulatory Visit: Payer: Self-pay | Admitting: Family Medicine

## 2023-11-02 ENCOUNTER — Ambulatory Visit: Payer: Self-pay | Admitting: Family Medicine

## 2023-11-02 VITALS — BP 114/79 | HR 71 | Temp 97.8°F | Ht 75.0 in | Wt 226.6 lb

## 2023-11-02 DIAGNOSIS — Z1211 Encounter for screening for malignant neoplasm of colon: Secondary | ICD-10-CM | POA: Diagnosis not present

## 2023-11-02 DIAGNOSIS — I10 Essential (primary) hypertension: Secondary | ICD-10-CM

## 2023-11-02 DIAGNOSIS — F411 Generalized anxiety disorder: Secondary | ICD-10-CM

## 2023-11-02 MED ORDER — LISINOPRIL 10 MG PO TABS: 10.0000 mg | ORAL_TABLET | Freq: Every day | ORAL | 1 refills | Status: DC

## 2023-11-02 MED ORDER — SERTRALINE HCL 100 MG PO TABS: 100.0000 mg | ORAL_TABLET | Freq: Every day | ORAL | 1 refills | Status: DC

## 2023-11-02 NOTE — Assessment & Plan Note (Signed)
 Under good control on current regimen. Continue current regimen. Continue to monitor. Call with any concerns. Refills given.

## 2023-11-02 NOTE — Assessment & Plan Note (Signed)
 Under good control on current regimen. Continue current regimen. Continue to monitor. Call with any concerns. Refills given. Labs drawn today.

## 2023-11-02 NOTE — Progress Notes (Signed)
 BP 114/79 (BP Location: Left Arm, Patient Position: Sitting, Cuff Size: Large)   Pulse 71   Temp 97.8 F (36.6 C) (Oral)   Ht 6\' 3"  (1.905 m)   Wt 226 lb 9.6 oz (102.8 kg)   SpO2 97%   BMI 28.32 kg/m    Subjective:    Patient ID: Shawn Andrade, male    DOB: July 12, 1975, 48 y.o.   MRN: [id number]  HPI: Shawn Andrade is a 48 y.o. male  Chief Complaint  Patient presents with   Hypertension   Anxiety   HYPERTENSION  Hypertension status: controlled  Satisfied with current treatment? yes Duration of hypertension: chronic BP monitoring frequency:  rarely BP medication side effects:  no Medication compliance: excellent compliance Previous BP meds: lisinopril  Aspirin: no Recurrent headaches: no Visual changes: no Palpitations: no Dyspnea: no Chest pain: no Lower extremity edema: no Dizzy/lightheaded: no  ANXIETY/DEPRESSION Duration: chronic Status:controlled Anxious mood: no  Excessive worrying: no Irritability: no  Sweating: no Nausea: no Palpitations:no Hyperventilation: no Panic attacks: no Agoraphobia: no  Obscessions/compulsions: no Depressed mood: no    11/02/2023    8:35 AM 01/28/2023   10:12 AM 01/20/2022    4:04 PM 08/07/2021    3:16 PM 06/06/2021    1:03 PM  Depression screen PHQ 2/9  Decreased Interest 0 0 1 0 1  Down, Depressed, Hopeless 0 0 0 0 0  PHQ - 2 Score 0 0 1 0 1  Altered sleeping 0 0 0 0 1  Tired, decreased energy 0 1 0 0 0  Change in appetite 0 1 0 0 0  Feeling bad or failure about yourself  0 0 0 0 0  Trouble concentrating 0 0 0 0 0  Moving slowly or fidgety/restless 0 0 0 0 0  Suicidal thoughts 0 0 0 0 0  PHQ-9 Score 0 2 1 0 2  Difficult doing work/chores  Not difficult at all Not difficult at all Not difficult at all       11/02/2023    8:36 AM 01/28/2023   10:12 AM 01/20/2022    4:04 PM 08/07/2021    3:16 PM  GAD 7 : Generalized Anxiety Score  Nervous, Anxious, on Edge 0 0 1 0  Control/stop worrying 0 0 1 0  Worry too much  - different things 0 0 1 1  Trouble relaxing 0 0 0 0  Restless 0 0 0 0  Easily annoyed or irritable 0 0 0 0  Afraid - awful might happen 0 0 0 0  Total GAD 7 Score 0 0 3 1  Anxiety Difficulty  Not difficult at all  Not difficult at all   Anhedonia: no Weight changes: no Insomnia: no   Hypersomnia: no Fatigue/loss of energy: no Feelings of worthlessness: no Feelings of guilt: no Impaired concentration/indecisiveness: no Suicidal ideations: no  Crying spells: no Recent Stressors/Life Changes: no   Relationship problems: no   Family stress: no     Financial stress: no    Job stress: no    Recent death/loss: no   Relevant past medical, surgical, family and social history reviewed and updated as indicated. Interim medical history since our last visit reviewed. Allergies and medications reviewed and updated.  Review of Systems  Constitutional: Negative.   Respiratory: Negative.    Cardiovascular: Negative.   Musculoskeletal: Negative.   Psychiatric/Behavioral: Negative.      Per HPI unless specifically indicated above     Objective:  BP 114/79 (BP Location: Left Arm, Patient Position: Sitting, Cuff Size: Large)   Pulse 71   Temp 97.8 F (36.6 C) (Oral)   Ht 6\' 3"  (1.905 m)   Wt 226 lb 9.6 oz (102.8 kg)   SpO2 97%   BMI 28.32 kg/m   Wt Readings from Last 3 Encounters:  11/02/23 226 lb 9.6 oz (102.8 kg)  05/04/23 220 lb 3.2 oz (99.9 kg)  01/28/23 224 lb 3.2 oz (101.7 kg)    Physical Exam Vitals and nursing note reviewed.  Constitutional:      General: He is not in acute distress.    Appearance: Normal appearance. He is not ill-appearing, toxic-appearing or diaphoretic.  HENT:     Head: Normocephalic and atraumatic.     Right Ear: External ear normal.     Left Ear: External ear normal.     Nose: Nose normal.     Mouth/Throat:     Mouth: Mucous membranes are moist.     Pharynx: Oropharynx is clear.  Eyes:     General: No scleral icterus.       Right  eye: No discharge.        Left eye: No discharge.     Extraocular Movements: Extraocular movements intact.     Conjunctiva/sclera: Conjunctivae normal.     Pupils: Pupils are equal, round, and reactive to light.  Cardiovascular:     Rate and Rhythm: Normal rate and regular rhythm.     Pulses: Normal pulses.     Heart sounds: Normal heart sounds. No murmur heard.    No friction rub. No gallop.  Pulmonary:     Effort: Pulmonary effort is normal. No respiratory distress.     Breath sounds: Normal breath sounds. No stridor. No wheezing, rhonchi or rales.  Chest:     Chest wall: No tenderness.  Musculoskeletal:        General: Normal range of motion.     Cervical back: Normal range of motion and neck supple.  Skin:    General: Skin is warm and dry.     Capillary Refill: Capillary refill takes less than 2 seconds.     Coloration: Skin is not jaundiced or pale.     Findings: No bruising, erythema, lesion or rash.  Neurological:     General: No focal deficit present.     Mental Status: He is alert and oriented to person, place, and time. Mental status is at baseline.  Psychiatric:        Mood and Affect: Mood normal.        Behavior: Behavior normal.        Thought Content: Thought content normal.        Judgment: Judgment normal.     Results for orders placed or performed in visit on 05/04/23  Basic metabolic panel   Collection Time: 05/04/23  4:07 PM  Result Value Ref Range   Glucose 96 70 - 99 mg/dL   BUN 18 6 - 24 mg/dL   Creatinine, Ser 4.09 0.76 - 1.27 mg/dL   eGFR 99 >81 XB/JYN/8.29   BUN/Creatinine Ratio 19 9 - 20   Sodium 142 134 - 144 mmol/L   Potassium 3.9 3.5 - 5.2 mmol/L   Chloride 105 96 - 106 mmol/L   CO2 22 20 - 29 mmol/L   Calcium 9.2 8.7 - 10.2 mg/dL      Assessment & Plan:   Problem List Items Addressed This Visit  Cardiovascular and Mediastinum   Primary hypertension - Primary   Under good control on current regimen. Continue current regimen.  Continue to monitor. Call with any concerns. Refills given. Labs drawn today.       Relevant Medications   lisinopril  (ZESTRIL ) 10 MG tablet   Other Relevant Orders   Basic metabolic panel with GFR     Other   Anxiety disorder   Under good control on current regimen. Continue current regimen. Continue to monitor. Call with any concerns. Refills given.        Relevant Medications   sertraline  (ZOLOFT ) 100 MG tablet   Other Visit Diagnoses       Screening for colon cancer       Cologuard ordered today.   Relevant Orders   Cologuard        Follow up plan: Return in about 6 months (around 05/04/2024) for physical.

## 2023-11-03 ENCOUNTER — Ambulatory Visit: Payer: Self-pay | Admitting: Family Medicine

## 2023-11-03 LAB — BASIC METABOLIC PANEL WITH GFR
BUN/Creatinine Ratio: 20 (ref 9–20)
BUN: 16 mg/dL (ref 6–24)
CO2: 20 mmol/L (ref 20–29)
Calcium: 9.5 mg/dL (ref 8.7–10.2)
Chloride: 105 mmol/L (ref 96–106)
Creatinine, Ser: 0.81 mg/dL (ref 0.76–1.27)
Glucose: 99 mg/dL (ref 70–99)
Potassium: 4.1 mmol/L (ref 3.5–5.2)
Sodium: 142 mmol/L (ref 134–144)
eGFR: 109 mL/min/{1.73_m2} (ref >59)

## 2024-05-08 ENCOUNTER — Ambulatory Visit (INDEPENDENT_AMBULATORY_CARE_PROVIDER_SITE_OTHER): Admitting: Family Medicine

## 2024-05-08 VITALS — BP 132/70 | HR 79 | Temp 98.0°F | Ht 75.0 in | Wt 218.4 lb

## 2024-05-08 DIAGNOSIS — Z0000 Encounter for general adult medical examination without abnormal findings: Secondary | ICD-10-CM | POA: Diagnosis not present

## 2024-05-08 DIAGNOSIS — F411 Generalized anxiety disorder: Secondary | ICD-10-CM

## 2024-05-08 DIAGNOSIS — I10 Essential (primary) hypertension: Secondary | ICD-10-CM | POA: Diagnosis not present

## 2024-05-08 MED ORDER — LISINOPRIL 10 MG PO TABS: 10.0000 mg | ORAL_TABLET | Freq: Every day | ORAL | 1 refills | Status: AC

## 2024-05-08 MED ORDER — SERTRALINE HCL 100 MG PO TABS: 100.0000 mg | ORAL_TABLET | Freq: Every day | ORAL | 1 refills | Status: AC

## 2024-05-08 NOTE — Progress Notes (Signed)
 BP 132/70   Pulse 79   Temp 98 F (36.7 C) (Oral)   Ht 6' 3 (1.905 m)   Wt 218 lb 6.4 oz (99.1 kg)   SpO2 98%   BMI 27.30 kg/m    Subjective:    Patient ID: Shawn Andrade, male    DOB: [birth year], 48 y.o.   MRN: [id number]  HPI: Shawn Andrade is a 48 y.o. male presenting on 05/08/2024 for comprehensive medical examination. Current medical complaints include:  HYPERTENSION  Hypertension status: uncontrolled  Satisfied with current treatment? yes Duration of hypertension: chronic BP monitoring frequency:  not checking BP medication side effects:  no Medication compliance: poor compliance Previous BP meds: lisinopril  Aspirin: no Recurrent headaches: no Visual changes: no Palpitations: no Dyspnea: no Chest pain: no Lower extremity edema: no Dizzy/lightheaded: no  ANXIETY/STRESS Duration: chronic Status:controlled Anxious mood: no  Excessive worrying: no Irritability: no  Sweating: no Nausea: no Palpitations:no Hyperventilation: no Panic attacks: no Agoraphobia: no  Obscessions/compulsions: no Depressed mood: no    05/08/2024    3:46 PM 11/02/2023    8:35 AM 01/28/2023   10:12 AM 01/20/2022    4:04 PM 08/07/2021    3:16 PM  Depression screen PHQ 2/9  Decreased Interest 0 0 0 1 0  Down, Depressed, Hopeless 0 0 0 0 0  PHQ - 2 Score 0 0 0 1 0  Altered sleeping 0 0 0 0 0  Tired, decreased energy 0 0 1 0 0  Change in appetite 0 0 1 0 0  Feeling bad or failure about yourself  0 0 0 0 0  Trouble concentrating 0 0 0 0 0  Moving slowly or fidgety/restless 0 0 0 0 0  Suicidal thoughts 0 0 0 0 0  PHQ-9 Score 0 0  2  1  0   Difficult doing work/chores   Not difficult at all Not difficult at all Not difficult at all     Data saved with a previous flowsheet row definition   Anhedonia: no Weight changes: no Insomnia: no   Hypersomnia: no Fatigue/loss of energy: no Feelings of worthlessness: no Feelings of guilt: no Impaired concentration/indecisiveness:  no Suicidal ideations: no  Crying spells: no Recent Stressors/Life Changes: no   Relationship problems: no   Family stress: no     Financial stress: no    Job stress: no    Recent death/loss: no   He currently lives with: wife Interim Problems from his last visit: no  Depression Screen done today and results listed below:     05/08/2024    3:46 PM 11/02/2023    8:35 AM 01/28/2023   10:12 AM 01/20/2022    4:04 PM 08/07/2021    3:16 PM  Depression screen PHQ 2/9  Decreased Interest 0 0 0 1 0  Down, Depressed, Hopeless 0 0 0 0 0  PHQ - 2 Score 0 0 0 1 0  Altered sleeping 0 0 0 0 0  Tired, decreased energy 0 0 1 0 0  Change in appetite 0 0 1 0 0  Feeling bad or failure about yourself  0 0 0 0 0  Trouble concentrating 0 0 0 0 0  Moving slowly or fidgety/restless 0 0 0 0 0  Suicidal thoughts 0 0 0 0 0  PHQ-9 Score 0 0  2  1  0   Difficult doing work/chores   Not difficult at all Not difficult at all Not difficult at all  Data saved with a previous flowsheet row definition    Past Medical History:  Past Medical History:  Diagnosis Date   Allergy    Anxiety    Depression 2007   Bing treated   Elevated blood pressure    Hypertension 2023   IFG (impaired fasting glucose)     Surgical History:  Past Surgical History:  Procedure Laterality Date   NO PAST SURGERIES      Medications:  Current Outpatient Medications on File Prior to Visit  Medication Sig   LORazepam  (ATIVAN ) 0.5 MG tablet Take 1 tablet (0.5 mg total) by mouth 2 (two) times daily as needed for anxiety.   No current facility-administered medications on file prior to visit.    Allergies:  No Known Allergies  Social History:  Social History   Socioeconomic History   Marital status: Married    Spouse name: Not on file   Number of children: 2   Years of education: Not on file   Highest education level: 12th grade  Occupational History   Occupation: FIREFIGHTER    Employer: [employer]   Tobacco Use   Smoking status: Every Day    Types: E-cigarettes   Smokeless tobacco: Current    Types: Snuff  Vaping Use   Vaping status: Never Used  Substance and Sexual Activity   Alcohol use: Not Currently    Comment: one or less per day, rare   Drug use: Never   Sexual activity: Yes    Birth control/protection: None  Other Topics Concern   Not on file  Social History Narrative   Not on file   Social Drivers of Health   Financial Resource Strain: Low Risk  (05/08/2024)   Overall Financial Resource Strain (CARDIA)    Difficulty of Paying Living Expenses: Not very hard  Food Insecurity: No Food Insecurity (05/08/2024)   Hunger Vital Sign    Worried About Running Out of Food in the Last Year: Never true    Ran Out of Food in the Last Year: Never true  Transportation Needs: No Transportation Needs (05/08/2024)   PRAPARE - Administrator, Civil Service (Medical): No    Lack of Transportation (Non-Medical): No  Physical Activity: Sufficiently Active (05/08/2024)   Exercise Vital Sign    Days of Exercise per Week: 5 days    Minutes of Exercise per Session: 120 min  Stress: No Stress Concern Present (05/08/2024)   [employer] of Occupational Health - Occupational Stress Questionnaire    Feeling of Stress: Not at all  Social Connections: Moderately Isolated (05/08/2024)   Social Connection and Isolation Panel    Frequency of Communication with Friends and Family: More than three times a week    Frequency of Social Gatherings with Friends and Family: Three times a week    Attends Religious Services: Never    Active Member of Clubs or Organizations: No    Attends Banker Meetings: Not on file    Marital Status: Married  Catering Manager Violence: Not At Risk (05/08/2024)   Humiliation, Afraid, Rape, and Kick questionnaire    Fear of Current or Ex-Partner: No    Emotionally Abused: No    Physically Abused: No    Sexually Abused: No   Social History    Tobacco Use  Smoking Status Every Day   Types: E-cigarettes  Smokeless Tobacco Current   Types: Snuff   Social History   Substance and Sexual Activity  Alcohol Use Not Currently  Comment: one or less per day, rare    Family History:  Family History  Problem Relation Age of Onset   Hypertension Father    Hypertension Mother    Anxiety disorder Mother    Cancer Paternal Grandfather     Past medical history, surgical history, medications, allergies, family history and social history reviewed with patient today and changes made to appropriate areas of the chart.   Review of Systems  Constitutional: Negative.   HENT: Negative.    Eyes:  Positive for blurred vision. Negative for double vision, photophobia, pain, discharge and redness.  Respiratory: Negative.    Cardiovascular: Negative.   Gastrointestinal:  Positive for heartburn. Negative for abdominal pain, blood in stool, constipation, diarrhea, melena, nausea and vomiting.  Genitourinary: Negative.   Musculoskeletal: Negative.   Skin: Negative.   Neurological: Negative.   Endo/Heme/Allergies:  Positive for environmental allergies. Negative for polydipsia. Does not bruise/bleed easily.  Psychiatric/Behavioral: Negative.     All other ROS negative except what is listed above and in the HPI.      Objective:    BP 132/70   Pulse 79   Temp 98 F (36.7 C) (Oral)   Ht 6' 3 (1.905 m)   Wt 218 lb 6.4 oz (99.1 kg)   SpO2 98%   BMI 27.30 kg/m   Wt Readings from Last 3 Encounters:  05/08/24 218 lb 6.4 oz (99.1 kg)  11/02/23 226 lb 9.6 oz (102.8 kg)  05/04/23 220 lb 3.2 oz (99.9 kg)    Physical Exam Vitals and nursing note reviewed.  Constitutional:      General: He is not in acute distress.    Appearance: Normal appearance. He is not ill-appearing, toxic-appearing or diaphoretic.  HENT:     Head: Normocephalic and atraumatic.     Right Ear: Tympanic membrane, ear canal and external ear normal. There is no  impacted cerumen.     Left Ear: Tympanic membrane, ear canal and external ear normal. There is no impacted cerumen.     Nose: Nose normal. No congestion or rhinorrhea.     Mouth/Throat:     Mouth: Mucous membranes are moist.     Pharynx: Oropharynx is clear. No oropharyngeal exudate or posterior oropharyngeal erythema.  Eyes:     General: No scleral icterus.       Right eye: No discharge.        Left eye: No discharge.     Extraocular Movements: Extraocular movements intact.     Conjunctiva/sclera: Conjunctivae normal.     Pupils: Pupils are equal, round, and reactive to light.  Neck:     Vascular: No carotid bruit.  Cardiovascular:     Rate and Rhythm: Normal rate and regular rhythm.     Pulses: Normal pulses.     Heart sounds: No murmur heard.    No friction rub. No gallop.  Pulmonary:     Effort: Pulmonary effort is normal. No respiratory distress.     Breath sounds: Normal breath sounds. No stridor. No wheezing, rhonchi or rales.  Chest:     Chest wall: No tenderness.  Abdominal:     General: Abdomen is flat. Bowel sounds are normal. There is no distension.     Palpations: Abdomen is soft. There is no mass.     Tenderness: There is no abdominal tenderness. There is no right CVA tenderness, left CVA tenderness, guarding or rebound.     Hernia: No hernia is present.  Genitourinary:    Comments:  Genital exam deferred with shared decision making Musculoskeletal:        General: No swelling, tenderness, deformity or signs of injury.     Cervical back: Normal range of motion and neck supple. No rigidity. No muscular tenderness.     Right lower leg: No edema.     Left lower leg: No edema.  Lymphadenopathy:     Cervical: No cervical adenopathy.  Skin:    General: Skin is warm and dry.     Capillary Refill: Capillary refill takes less than 2 seconds.     Coloration: Skin is not jaundiced or pale.     Findings: No bruising, erythema, lesion or rash.  Neurological:     General:  No focal deficit present.     Mental Status: He is alert and oriented to person, place, and time.     Cranial Nerves: No cranial nerve deficit.     Sensory: No sensory deficit.     Motor: No weakness.     Coordination: Coordination normal.     Gait: Gait normal.     Deep Tendon Reflexes: Reflexes normal.  Psychiatric:        Mood and Affect: Mood normal.        Behavior: Behavior normal.        Thought Content: Thought content normal.        Judgment: Judgment normal.     Results for orders placed or performed in visit on 11/02/23  Basic metabolic panel with GFR   Collection Time: 11/02/23  8:51 AM  Result Value Ref Range   Glucose 99 70 - 99 mg/dL   BUN 16 6 - 24 mg/dL   Creatinine, Ser 9.18 0.76 - 1.27 mg/dL   eGFR 890 >40 fO/fpw/8.26   BUN/Creatinine Ratio 20 9 - 20   Sodium 142 134 - 144 mmol/L   Potassium 4.1 3.5 - 5.2 mmol/L   Chloride 105 96 - 106 mmol/L   CO2 20 20 - 29 mmol/L   Calcium 9.5 8.7 - 10.2 mg/dL      Assessment & Plan:   Problem List Items Addressed This Visit       Cardiovascular and Mediastinum   Primary hypertension   Under good control on current regimen. Continue current regimen. Continue to monitor. Call with any concerns. Refills given. Labs drawn today.        Relevant Medications   lisinopril  (ZESTRIL ) 10 MG tablet   Other Relevant Orders   Urine Microalbumin w/creat. ratio     Other   Anxiety disorder   Under good control on current regimen. Continue current regimen. Continue to monitor. Call with any concerns. Refills given. Will consider decreasing medicine next visit. Call with any concerns.        Relevant Medications   sertraline  (ZOLOFT ) 100 MG tablet   Other Visit Diagnoses       Routine general medical examination at a health care facility    -  Primary   Vaccines up to date/declined. Screening labs checked today. Cologuard at home. Continue diet and exercise. Call with any concerns.   Relevant Orders   Comprehensive  metabolic panel with GFR   CBC with Differential/Platelet   Lipid Panel w/o Chol/HDL Ratio   PSA   TSH   Hepatitis B surface antibody,quantitative        LABORATORY TESTING:  Health maintenance labs ordered today as discussed above.   The natural history of prostate cancer and ongoing controversy regarding screening and potential  treatment outcomes of prostate cancer has been discussed with the patient. The meaning of a false positive PSA and a false negative PSA has been discussed. He indicates understanding of the limitations of this screening test and wishes to proceed with screening PSA testing.   IMMUNIZATIONS:   - Tdap: Tetanus vaccination status reviewed: last tetanus booster within 10 years. - Influenza: Refused - Prevnar: Refused - COVID: Refused - HPV: Not applicable - Shingrix vaccine: Not applicable  SCREENING: - Colonoscopy: has a cologuard at home  Discussed with patient purpose of the colonoscopy is to detect colon cancer at curable precancerous or early stages   PATIENT COUNSELING:    Sexuality: Discussed sexually transmitted diseases, partner selection, use of condoms, avoidance of unintended pregnancy  and contraceptive alternatives.   Advised to avoid cigarette smoking.  I discussed with the patient that most people either abstain from alcohol or drink within safe limits (<=14/week and <=4 drinks/occasion for males, <=7/weeks and <= 3 drinks/occasion for females) and that the risk for alcohol disorders and other health effects rises proportionally with the number of drinks per week and how often a drinker exceeds daily limits.  Discussed cessation/primary prevention of drug use and availability of treatment for abuse.   Diet: Encouraged to adjust caloric intake to maintain  or achieve ideal body weight, to reduce intake of dietary saturated fat and total fat, to limit sodium intake by avoiding high sodium foods and not adding table salt, and to maintain  adequate dietary potassium and calcium preferably from fresh fruits, vegetables, and low-fat dairy products.    stressed the importance of regular exercise  Injury prevention: Discussed safety belts, safety helmets, smoke detector, smoking near bedding or upholstery.   Dental health: Discussed importance of regular tooth brushing, flossing, and dental visits.   Follow up plan: NEXT PREVENTATIVE PHYSICAL DUE IN 1 YEAR. Return in about 6 months (around 11/06/2024).

## 2024-05-08 NOTE — Assessment & Plan Note (Signed)
 Under good control on current regimen. Continue current regimen. Continue to monitor. Call with any concerns. Refills given. Labs drawn today.

## 2024-05-08 NOTE — Assessment & Plan Note (Signed)
 Under good control on current regimen. Continue current regimen. Continue to monitor. Call with any concerns. Refills given. Will consider decreasing medicine next visit. Call with any concerns.

## 2024-05-09 ENCOUNTER — Ambulatory Visit: Payer: Self-pay | Admitting: Family Medicine

## 2024-05-09 LAB — CBC WITH DIFFERENTIAL/PLATELET
Basophils Absolute: 0 x10E3/uL (ref 0.0–0.2)
Basos: 0 %
EOS (ABSOLUTE): 0.2 x10E3/uL (ref 0.0–0.4)
Eos: 3 %
Hematocrit: 44.7 % (ref 37.5–51.0)
Hemoglobin: 14.7 g/dL (ref 13.0–17.7)
Immature Grans (Abs): 0 x10E3/uL (ref 0.0–0.1)
Immature Granulocytes: 0 %
Lymphocytes Absolute: 1.9 x10E3/uL (ref 0.7–3.1)
Lymphs: 27 %
MCH: 28.3 pg (ref 26.6–33.0)
MCHC: 32.9 g/dL (ref 31.5–35.7)
MCV: 86 fL (ref 79–97)
Monocytes Absolute: 0.5 x10E3/uL (ref 0.1–0.9)
Monocytes: 7 %
Neutrophils Absolute: 4.4 x10E3/uL (ref 1.4–7.0)
Neutrophils: 63 %
Platelets: 294 x10E3/uL (ref 150–450)
RBC: 5.19 x10E6/uL (ref 4.14–5.80)
RDW: 13.1 % (ref 11.6–15.4)
WBC: 7 x10E3/uL (ref 3.4–10.8)

## 2024-05-09 LAB — COMPREHENSIVE METABOLIC PANEL WITH GFR
ALT: 17 IU/L (ref 0–44)
AST: 18 IU/L (ref 0–40)
Albumin: 4.5 g/dL (ref 4.1–5.1)
Alkaline Phosphatase: 82 IU/L (ref 47–123)
BUN/Creatinine Ratio: 25 — ABNORMAL HIGH (ref 9–20)
BUN: 19 mg/dL (ref 6–24)
Bilirubin Total: 0.3 mg/dL (ref 0.0–1.2)
CO2: 21 mmol/L (ref 20–29)
Calcium: 9.7 mg/dL (ref 8.7–10.2)
Chloride: 105 mmol/L (ref 96–106)
Creatinine, Ser: 0.75 mg/dL — ABNORMAL LOW (ref 0.76–1.27)
Globulin, Total: 2.5 g/dL (ref 1.5–4.5)
Glucose: 126 mg/dL — ABNORMAL HIGH (ref 70–99)
Potassium: 4 mmol/L (ref 3.5–5.2)
Sodium: 141 mmol/L (ref 134–144)
Total Protein: 7 g/dL (ref 6.0–8.5)
eGFR: 111 mL/min/1.73 (ref >59)

## 2024-05-09 LAB — LIPID PANEL W/O CHOL/HDL RATIO
Cholesterol, Total: 216 mg/dL — ABNORMAL HIGH (ref 100–199)
HDL: 46 mg/dL (ref >39)
LDL Chol Calc (NIH): 124 mg/dL — ABNORMAL HIGH (ref 0–99)
Triglycerides: 264 mg/dL — ABNORMAL HIGH (ref 0–149)
VLDL Cholesterol Cal: 46 mg/dL — ABNORMAL HIGH (ref 5–40)

## 2024-05-09 LAB — TSH: TSH: 0.868 u[IU]/mL (ref 0.450–4.500)

## 2024-05-09 LAB — MICROALBUMIN / CREATININE URINE RATIO
Creatinine, Urine: 102.8 mg/dL
Microalb/Creat Ratio: 4 mg/g{creat} (ref 0–29)
Microalbumin, Urine: 4.5 ug/mL

## 2024-05-09 LAB — PSA: Prostate Specific Ag, Serum: 1.1 ng/mL (ref 0.0–4.0)

## 2024-05-09 LAB — HEPATITIS B SURFACE ANTIBODY, QUANTITATIVE: Hepatitis B Surf Ab Quant: 150 m[IU]/mL

## 2024-11-07 ENCOUNTER — Ambulatory Visit: Admitting: Family Medicine
